# Patient Record
Sex: Male | Born: 1985 | Race: Black or African American | Hispanic: No | Marital: Married | Smoking: Never smoker
Health system: Southern US, Community
[De-identification: ages and names within clinical notes are randomized; demographics above are authoritative.]

## PROBLEM LIST (undated history)

## (undated) DIAGNOSIS — I10 Essential (primary) hypertension: Secondary | ICD-10-CM

## (undated) DIAGNOSIS — E119 Type 2 diabetes mellitus without complications: Secondary | ICD-10-CM

## (undated) DIAGNOSIS — G4730 Sleep apnea, unspecified: Secondary | ICD-10-CM

## (undated) DIAGNOSIS — M109 Gout, unspecified: Secondary | ICD-10-CM

---

## 2004-03-16 ENCOUNTER — Emergency Department (HOSPITAL_COMMUNITY): Admission: EM | Admit: 2004-03-16 | Discharge: 2004-03-16 | Payer: Self-pay | Admitting: Emergency Medicine

## 2004-05-03 ENCOUNTER — Emergency Department (HOSPITAL_COMMUNITY): Admission: EM | Admit: 2004-05-03 | Discharge: 2004-05-03 | Payer: Self-pay | Admitting: Emergency Medicine

## 2006-06-07 ENCOUNTER — Emergency Department (HOSPITAL_COMMUNITY): Admission: EM | Admit: 2006-06-07 | Discharge: 2006-06-08 | Payer: Self-pay | Admitting: Emergency Medicine

## 2006-06-08 ENCOUNTER — Emergency Department (HOSPITAL_COMMUNITY): Admission: EM | Admit: 2006-06-08 | Discharge: 2006-06-08 | Payer: Self-pay | Admitting: Emergency Medicine

## 2007-09-20 ENCOUNTER — Emergency Department (HOSPITAL_COMMUNITY): Admission: EM | Admit: 2007-09-20 | Discharge: 2007-09-20 | Payer: Self-pay | Admitting: Emergency Medicine

## 2008-11-29 ENCOUNTER — Emergency Department (HOSPITAL_COMMUNITY): Admission: EM | Admit: 2008-11-29 | Discharge: 2008-11-29 | Payer: Self-pay | Admitting: Emergency Medicine

## 2011-04-30 NOTE — Consult Note (Signed)
Christopher Hays, Christopher Hays                  ACCOUNT NO.:  [id number]   MEDICAL RECORD NO.:  [id number]          PATIENT TYPE:  EMS   LOCATION:  ED                           FACILITY:  [facility]   PHYSICIAN:  Thornton Park. Daphine Deutscher, MD  DATE OF BIRTH:  13-Mar-1986   DATE OF CONSULTATION:  06/08/2006  DATE OF DISCHARGE:                                   CONSULTATION   CHIEF COMPLAINT:  Abdominal pain   HISTORY OF PRESENT ILLNESS:  This is a 25 year old black male who came into  the ER yesterday with onset of right sided abdominal pain noted yesterday.  This did not really double him over but he was concerned about it and he  came in and he was seen by Rodrigo Ran and worked up. A CT scan without  contrast was done, which showed him to be constipated. He was given some  MiraLax to drink. He has drunk that and that has made some improvement in  his pain but he still was having some pain when you press on the right side  but no rebound or guarding. No pain with bouncing or movement. The patient  is currently hungry. He has not had any nausea, vomiting, or diarrhea.   ALLERGIES:  NO KNOWN DRUG ALLERGIES.   MEDICATIONS:  He has recently been taking MiraLax.   PHYSICAL EXAMINATION:  VITAL SIGNS:  Afebrile. Blood pressure 150/97, pulse  76, respiratory rate 16.  HEENT:  Examination unremarkable.  CHEST:  Clear.  HEART:  Sinus rhythm without murmurs or gallops.  ABDOMEN:  Obese. Minimal soreness to the right upper quadrant but I can  press deeply at McBurney's point without getting much in the way of a  response.   LABORATORY DATA:  CT scan was reviewed and there is contrast that fills a  portion of the appendix. There are no surrounding inflammatory changes.  There is some question about the mid portion being a little enlarged but  again, it is not a convincing finding by any means and certainly, there is  no inflammatory change or stranding.   White count 5,000. Hemoglobin 14.8 with a low absolute  granulocyte count.   ASSESSMENT:  This man appears constipated and a definite etiology for his  abdominal discomfort is not clear. I do not think this man has appendicitis  and I would treat him with MiraLax and I told him to get an enema and to see  if he didn't get better. If he is not better tomorrow, he certainly can come  back to the emergency room and we can re-evaluate this.   IMPRESSION:  Abdominal pain, etiology uncertain.   DISPOSITION:  The patient to go home.   PLAN:  To take an enema and to continue the MiraLax and remain hydrated.  Despite his hunger, I have asked him to stay on liquids, to try to see if we  can get his colon cleaned out and see if his pain will abate.      Thornton Park Daphine Deutscher, MD  Electronically Signed     MBM/MEDQ  D:  06/08/2006  T:  06/08/2006  Job:  [id number]

## 2011-09-17 LAB — URIC ACID: Uric Acid, Serum: 10.2 mg/dL — ABNORMAL HIGH (ref 4.0–7.8)

## 2012-04-13 ENCOUNTER — Emergency Department (HOSPITAL_BASED_OUTPATIENT_CLINIC_OR_DEPARTMENT_OTHER)
Admission: EM | Admit: 2012-04-13 | Discharge: 2012-04-14 | Disposition: A | Discharge status: Home / Self Care | Payer: Self-pay | Attending: Emergency Medicine | Admitting: Emergency Medicine

## 2012-04-13 ENCOUNTER — Encounter (HOSPITAL_BASED_OUTPATIENT_CLINIC_OR_DEPARTMENT_OTHER): Payer: Self-pay | Admitting: Emergency Medicine

## 2012-04-13 DIAGNOSIS — M533 Sacrococcygeal disorders, not elsewhere classified: Secondary | ICD-10-CM | POA: Insufficient documentation

## 2012-04-13 DIAGNOSIS — M545 Low back pain, unspecified: Secondary | ICD-10-CM

## 2012-04-13 NOTE — ED Notes (Signed)
Pt c/o lower back pain after lifting boxes at work.

## 2012-04-14 MED ORDER — CYCLOBENZAPRINE HCL 10 MG PO TABS
10.0000 mg | ORAL_TABLET | Freq: Once | ORAL | Status: AC
  Administered 2012-04-14: 10 mg via ORAL
  Filled 2012-04-14: qty 1

## 2012-04-14 MED ORDER — HYDROCODONE-ACETAMINOPHEN 5-325 MG PO TABS
1.0000 | ORAL_TABLET | Freq: Once | ORAL | Status: AC
  Administered 2012-04-14: 1 via ORAL
  Filled 2012-04-14: qty 1

## 2012-04-14 MED ORDER — HYDROCODONE-ACETAMINOPHEN 5-325 MG PO TABS: 1.0000 | ORAL_TABLET | Freq: Four times a day (QID) | ORAL | Status: AC | PRN

## 2012-04-14 MED ORDER — CYCLOBENZAPRINE HCL 10 MG PO TABS: 10.0000 mg | ORAL_TABLET | Freq: Three times a day (TID) | ORAL | Status: AC | PRN

## 2012-04-14 NOTE — ED Provider Notes (Signed)
History     CSN: [id number]  Arrival date & time 04/13/12  2316   First MD Initiated Contact with Patient 04/14/12 0112      Chief Complaint  Patient presents with  . Back Pain    (Consider location/radiation/quality/duration/timing/severity/associated sxs/prior treatment) HPI This is a 26 year old black male who works at [employer]. He was lifting boxes at work 2 days ago. At that evening he developed pain about the right sacroiliac joint. The pain is spasm-like. It is worse with certain positions and movement, improved with certain positions. He is been taking NSAIDs without relief. The pain is moderate in severity. He denies any numbness or weakness associated with it.  History reviewed. No pertinent past medical history.  History reviewed. No pertinent past surgical history.  No family history on file.  History  Substance Use Topics  . Smoking status: Never Smoker   . Smokeless tobacco: Not on file  . Alcohol Use: No      Review of Systems  All other systems reviewed and are negative.    Allergies  Review of patient's allergies indicates no known allergies.  Home Medications   Current Outpatient Rx  Name Route Sig Dispense Refill  . IBUPROFEN 400 MG PO TABS Oral Take 400 mg by mouth as needed.    Marland Kitchen NAPROXEN SODIUM 220 MG PO TABS Oral Take 220 mg by mouth 2 (two) times daily with a meal. Patient used this medication for pain.      BP 163/88  Pulse 74  Temp(Src) 98.2 F (36.8 C) (Oral)  Resp 18  Ht 6' (1.829 m)  Wt 270 lb (122.471 kg)  BMI 36.62 kg/m2  SpO2 100%  Physical Exam General: Well-developed, well-nourished male in no acute distress; appearance consistent with age of record HENT: normocephalic, atraumatic Eyes: Normal appearance Neck: supple Heart: regular rate and rhythm Lungs: Normal respiratory effort and excursion Back: Mild tenderness at right SI joint Abdomen: soft; nondistended Extremities: No deformity; full range of motion Neurologic:  Awake, alert and oriented; motor function intact in all extremities and symmetric; no facial droop Skin: Warm and dry Psychiatric: Normal mood and affect    ED Course  Procedures (including critical care time)     MDM          Hanley Seamen, MD 04/14/12 7829

## 2012-04-14 NOTE — Discharge Instructions (Signed)
Back Pain, Adult Low back pain is very common. About 1 in 5 people have back pain.The cause of low back pain is rarely dangerous. The pain often gets better over time.About half of people with a sudden onset of back pain feel better in just 2 weeks. About 8 in 10 people feel better by 6 weeks.  CAUSES Some common causes of back pain include:  Strain of the muscles or ligaments supporting the spine.   Wear and tear (degeneration) of the spinal discs.   Arthritis.   Direct injury to the back.  DIAGNOSIS Most of the time, the direct cause of low back pain is not known.However, back pain can be treated effectively even when the exact cause of the pain is unknown.Answering your caregiver's questions about your overall health and symptoms is one of the most accurate ways to make sure the cause of your pain is not dangerous. If your caregiver needs more information, he or she may order lab work or imaging tests (X-rays or MRIs).However, even if imaging tests show changes in your back, this usually does not require surgery. HOME CARE INSTRUCTIONS For many people, back pain returns.Since low back pain is rarely dangerous, it is often a condition that people can learn to manageon their own.   Remain active. It is stressful on the back to sit or stand in one place. Do not sit, drive, or stand in one place for more than 30 minutes at a time. Take short walks on level surfaces as soon as pain allows.Try to increase the length of time you walk each day.   Do not stay in bed.Resting more than 1 or 2 days can delay your recovery.   Do not avoid exercise or work.Your body is made to move.It is not dangerous to be active, even though your back may hurt.Your back will likely heal faster if you return to being active before your pain is gone.   Pay attention to your body when you bend and lift. Many people have less discomfortwhen lifting if they bend their knees, keep the load close to their  bodies,and avoid twisting. Often, the most comfortable positions are those that put less stress on your recovering back.   Find a comfortable position to sleep. Use a firm mattress and lie on your side with your knees slightly bent. If you lie on your back, put a pillow under your knees.   Only take over-the-counter or prescription medicines as directed by your caregiver. Over-the-counter medicines to reduce pain and inflammation are often the most helpful.Your caregiver may prescribe muscle relaxant drugs.These medicines help dull your pain so you can more quickly return to your normal activities and healthy exercise.   Put ice on the injured area.   Put ice in a plastic bag.   Place a towel between your skin and the bag.   Leave the ice on for 15 to 20 minutes, 3 to 4 times a day for the first 2 to 3 days. After that, ice and heat may be alternated to reduce pain and spasms.   Ask your caregiver about trying back exercises and gentle massage. This may be of some benefit.   Avoid feeling anxious or stressed.Stress increases muscle tension and can worsen back pain.It is important to recognize when you are anxious or stressed and learn ways to manage it.Exercise is a great option.  SEEK MEDICAL CARE IF:  You have pain that is not relieved with rest or medicine.   You have   pain that does not improve in 1 week.   You have new symptoms.   You are generally not feeling well.  SEEK IMMEDIATE MEDICAL CARE IF:   You have pain that radiates from your back into your legs.   You develop new bowel or bladder control problems.   You have unusual weakness or numbness in your arms or legs.   You develop nausea or vomiting.   You develop abdominal pain.   You feel faint.  Document Released: 11/29/2005 Document Revised: 11/18/2011 Document Reviewed: 04/19/2011 [employer] Patient Information 2012 [employer]. 

## 2012-04-14 NOTE — ED Notes (Signed)
Dr. Molpus at bedside. 

## 2012-09-17 ENCOUNTER — Emergency Department (HOSPITAL_BASED_OUTPATIENT_CLINIC_OR_DEPARTMENT_OTHER)
Admission: EM | Admit: 2012-09-17 | Discharge: 2012-09-17 | Disposition: A | Discharge status: Home / Self Care | Payer: Self-pay | Attending: Emergency Medicine | Admitting: Emergency Medicine

## 2012-09-17 ENCOUNTER — Encounter (HOSPITAL_BASED_OUTPATIENT_CLINIC_OR_DEPARTMENT_OTHER): Payer: Self-pay | Admitting: *Deleted

## 2012-09-17 DIAGNOSIS — R030 Elevated blood-pressure reading, without diagnosis of hypertension: Secondary | ICD-10-CM | POA: Insufficient documentation

## 2012-09-17 DIAGNOSIS — I10 Essential (primary) hypertension: Secondary | ICD-10-CM

## 2012-09-17 DIAGNOSIS — M109 Gout, unspecified: Secondary | ICD-10-CM | POA: Insufficient documentation

## 2012-09-17 HISTORY — DX: Gout, unspecified: M10.9

## 2012-09-17 LAB — URIC ACID: Uric Acid, Serum: 8.4 mg/dL — ABNORMAL HIGH (ref 4.0–7.8)

## 2012-09-17 LAB — BASIC METABOLIC PANEL
Chloride: 101 mEq/L (ref 96–112)
Creatinine, Ser: 1.1 mg/dL (ref 0.50–1.35)
GFR calc Af Amer: >90 mL/min (ref >90)
Glucose, Bld: 89 mg/dL (ref 70–99)
Potassium: 3.3 mEq/L — ABNORMAL LOW (ref 3.5–5.1)

## 2012-09-17 MED ORDER — KETOROLAC TROMETHAMINE 30 MG/ML IJ SOLN
30.0000 mg | Freq: Once | INTRAMUSCULAR | Status: AC
  Administered 2012-09-17: 30 mg via INTRAVENOUS
  Filled 2012-09-17: qty 1

## 2012-09-17 MED ORDER — NAPROXEN SODIUM 550 MG PO TABS: ORAL_TABLET | ORAL | Status: DC

## 2012-09-17 MED ORDER — SODIUM CHLORIDE 0.9 % IV SOLN
Freq: Once | INTRAVENOUS | Status: AC
  Administered 2012-09-17: 05:00:00 via INTRAVENOUS

## 2012-09-17 MED ORDER — HYDROCODONE-ACETAMINOPHEN 10-325 MG PO TABS: 1.0000 | ORAL_TABLET | Freq: Four times a day (QID) | ORAL | Status: DC | PRN

## 2012-09-17 NOTE — ED Provider Notes (Signed)
History     CSN: [id number]  Arrival date & time 09/17/12  0407   First MD Initiated Contact with Patient 09/17/12 [phone]      Chief Complaint  Patient presents with  . Hand Pain    (Consider location/radiation/quality/duration/timing/severity/associated sxs/prior treatment) HPI This is a 26 year old black male with a history of gout. His prior episodes of been in his great toe. He is here with a two-day history of pain at the base of his right thumb, the metacarpophalangeal joint. There is associated swelling but no erythema. He states the symptoms are consistent with the gout he has had in his toe. The pain is severe when he attempts to move the thumb, it is also worse with palpation. He has taken ibuprofen 400 mg without relief.  Past Medical History  Diagnosis Date  . Gout     History reviewed. No pertinent past surgical history.  No family history on file.  History  Substance Use Topics  . Smoking status: Never Smoker   . Smokeless tobacco: Not on file  . Alcohol Use: No     occasional       Review of Systems  All other systems reviewed and are negative.    Allergies  Review of patient's allergies indicates no known allergies.  Home Medications   Current Outpatient Rx  Name Route Sig Dispense Refill  . IBUPROFEN 400 MG PO TABS Oral Take 400 mg by mouth as needed.    Marland Kitchen NAPROXEN SODIUM 220 MG PO TABS Oral Take 220 mg by mouth 2 (two) times daily with a meal. Patient used this medication for pain.      BP 198/98  Pulse 65  Temp 98.4 F (36.9 C) (Oral)  Resp 20  Ht 6' (1.829 m)  Wt 260 lb (117.935 kg)  BMI 35.26 kg/m2  SpO2 100%  Physical Exam General: Well-developed, well-nourished male in no acute distress; appearance consistent with age of record HENT: normocephalic, atraumatic Eyes: Normal appearance Neck: supple Heart: regular rate and rhythm Lungs: clear to auscultation bilaterally Abdomen: soft; nondistended Extremities: No deformity;  swelling and profound tenderness of right first metacarpophalangeal joint without erythema or warmth Neurologic: Awake, alert and oriented; motor function intact in all extremities and symmetric; no facial droop Skin: Warm and dry Psychiatric: Normal mood and affect    ED Course  Procedures (including critical care time)     MDM   Nursing notes and vitals signs, including pulse oximetry, reviewed.  Summary of this visit's results, reviewed by myself:  Labs:  Results for orders placed during the hospital encounter of 09/17/12  BASIC METABOLIC PANEL      Component Value Range   Sodium 139  135 - 145 mEq/L   Potassium 3.3 (*) 3.5 - 5.1 mEq/L   Chloride 101  96 - 112 mEq/L   CO2 30  19 - 32 mEq/L   Glucose, Bld 89  70 - 99 mg/dL   BUN 13  6 - 23 mg/dL   Creatinine, Ser 6.29  0.50 - 1.35 mg/dL   Calcium 9.0  8.4 - 52.8 mg/dL   GFR calc non Af Amer >90  >90 mL/min   GFR calc Af Amer >90  >90 mL/min  URIC ACID      Component Value Range   Uric Acid, Serum 8.4 (*) 4.0 - 7.8 mg/dL            Hanley Seamen, MD 09/17/12 [phone]

## 2012-09-17 NOTE — ED Notes (Signed)
Pt. C/o of right hand/thumb pain that started on Friday. States pain with movement and to touch. Denies any injury. Thumb swollen on exam. Hx of gout.

## 2013-02-04 ENCOUNTER — Emergency Department (HOSPITAL_BASED_OUTPATIENT_CLINIC_OR_DEPARTMENT_OTHER)
Admission: EM | Admit: 2013-02-04 | Discharge: 2013-02-04 | Disposition: A | Discharge status: Home / Self Care | Payer: BC Managed Care – PPO | Attending: Emergency Medicine | Admitting: Emergency Medicine

## 2013-02-04 ENCOUNTER — Emergency Department (HOSPITAL_BASED_OUTPATIENT_CLINIC_OR_DEPARTMENT_OTHER): Payer: BC Managed Care – PPO

## 2013-02-04 ENCOUNTER — Encounter (HOSPITAL_BASED_OUTPATIENT_CLINIC_OR_DEPARTMENT_OTHER): Payer: Self-pay | Admitting: *Deleted

## 2013-02-04 DIAGNOSIS — M109 Gout, unspecified: Secondary | ICD-10-CM | POA: Insufficient documentation

## 2013-02-04 DIAGNOSIS — Z791 Long term (current) use of non-steroidal anti-inflammatories (NSAID): Secondary | ICD-10-CM | POA: Insufficient documentation

## 2013-02-04 MED ORDER — COLCHICINE 0.6 MG PO TABS: 0.6000 mg | ORAL_TABLET | Freq: Two times a day (BID) | ORAL | Status: DC

## 2013-02-04 MED ORDER — TRAMADOL HCL 50 MG PO TABS
50.0000 mg | ORAL_TABLET | Freq: Four times a day (QID) | ORAL | Status: DC | PRN
Start: 2013-02-04 — End: 2013-06-06

## 2013-02-04 NOTE — ED Notes (Signed)
Pt has hx of gout. Ran out of med a couple months ago. C/O left foot pain since this a.m.

## 2013-02-04 NOTE — ED Provider Notes (Signed)
History     CSN: [id number]  Arrival date & time 02/04/13  1143   First MD Initiated Contact with Patient 02/04/13 1210      Chief Complaint  Patient presents with  . Foot Pain    (Consider location/radiation/quality/duration/timing/severity/associated sxs/prior treatment) HPI Comments: Patient is a 27 year old male who presents with left ankle pain that started gradually this morning. Patient denies injury. The pain is aching and severe without radiation. Patient reports progressive worsening of pain. Ankle movement and weight bearing activity make the pain worse. Nothing makes the pain better. Patient reports associated swelling. Patient has not tried anything for pain relief. Patient denies obvious deformity, numbness/tingling, coolness/weakness of extremity, bruising, and any other injury. Patient has a history of gout and states this feels exactly like his gout pain. He has been treated with colchicine in the past which helps.     Patient is a 27 y.o. male presenting with lower extremity pain.  Foot Pain Associated symptoms include arthralgias.    Past Medical History  Diagnosis Date  . Gout     History reviewed. No pertinent past surgical history.  History reviewed. No pertinent family history.  History  Substance Use Topics  . Smoking status: Never Smoker   . Smokeless tobacco: Not on file  . Alcohol Use: No     Comment: occasional       Review of Systems  Musculoskeletal: Positive for arthralgias.  All other systems reviewed and are negative.    Allergies  Review of patient's allergies indicates no known allergies.  Home Medications   Current Outpatient Rx  Name  Route  Sig  Dispense  Refill  . HYDROcodone-acetaminophen (NORCO) 10-325 MG per tablet   Oral   Take 1 tablet by mouth every 6 (six) hours as needed for pain.   20 tablet   0   . ibuprofen (ADVIL,MOTRIN) 400 MG tablet   Oral   Take 400 mg by mouth as needed.         . naproxen  sodium (ANAPROX DS) 550 MG tablet      Take 1 tablet twice daily for gout. Best taken with a meal.   30 tablet   0   . naproxen sodium (ANAPROX) 220 MG tablet   Oral   Take 220 mg by mouth 2 (two) times daily with a meal. Patient used this medication for pain.           BP 163/103  Pulse 64  Temp(Src) 98.8 F (37.1 C) (Oral)  Resp 20  Ht 6' (1.829 m)  Wt 260 lb (117.935 kg)  BMI 35.25 kg/m2  SpO2 99%  Physical Exam  Nursing note and vitals reviewed. Constitutional: He is oriented to person, place, and time. He appears well-developed and well-nourished. No distress.  HENT:  Head: Normocephalic and atraumatic.  Eyes: Conjunctivae are normal.  Neck: Normal range of motion.  Cardiovascular: Normal rate and regular rhythm.  Exam reveals no gallop and no friction rub.   No murmur heard. Pulmonary/Chest: Effort normal and breath sounds normal. He has no wheezes. He has no rales. He exhibits no tenderness.  Abdominal: Soft. There is no tenderness.  Musculoskeletal: Normal range of motion.  Tenderness to palpation of left lateral malleolus. Mild edema. No obvious deformity. ROM slightly limited due to pain.   Neurological: He is alert and oriented to person, place, and time. Coordination normal.  Speech is goal-oriented. Moves limbs without ataxia.   Skin: Skin is warm and  dry.  Psychiatric: He has a normal mood and affect. His behavior is normal.    ED Course  Procedures (including critical care time)  Labs Reviewed - No data to display Dg Foot Complete Left  02/04/2013  *RADIOLOGY REPORT*  Clinical Data: Lateral foot pain, history of gout  LEFT FOOT - COMPLETE 3+ VIEW  Comparison: None.  Findings: Three views of the left foot submitted.  Mild hallux valgus deformity.  No acute fracture or subluxation.  No periosteal reaction or bony erosion.  Mild dorsal spurring tarsal region.  IMPRESSION: No acute fracture or subluxation.  Mild degenerative changes.  Mild hallux valgus.    Original Report Authenticated By: Natasha Mead, M.D.      1. Gout       MDM  12:39 PM Xray unremarkable. No signs of neurovascular compromise. Patient will have pain medication and colchicine for suspected gout. Patient instructed to return with worsening or concerning symptoms.         Emilia Beck, PA-C 02/04/13 1245

## 2013-02-04 NOTE — ED Provider Notes (Signed)
Medical screening examination/treatment/procedure(s) were performed by non-physician practitioner and as supervising physician I was immediately available for consultation/collaboration.  Geoffery Lyons, MD 02/04/13 [phone]

## 2013-02-05 ENCOUNTER — Encounter (HOSPITAL_BASED_OUTPATIENT_CLINIC_OR_DEPARTMENT_OTHER): Payer: Self-pay | Admitting: *Deleted

## 2013-02-05 ENCOUNTER — Emergency Department (HOSPITAL_BASED_OUTPATIENT_CLINIC_OR_DEPARTMENT_OTHER)
Admission: EM | Admit: 2013-02-05 | Discharge: 2013-02-05 | Disposition: A | Discharge status: Home / Self Care | Payer: BC Managed Care – PPO | Attending: Emergency Medicine | Admitting: Emergency Medicine

## 2013-02-05 ENCOUNTER — Emergency Department (HOSPITAL_BASED_OUTPATIENT_CLINIC_OR_DEPARTMENT_OTHER): Payer: BC Managed Care – PPO

## 2013-02-05 DIAGNOSIS — M7989 Other specified soft tissue disorders: Secondary | ICD-10-CM | POA: Insufficient documentation

## 2013-02-05 DIAGNOSIS — M109 Gout, unspecified: Secondary | ICD-10-CM | POA: Insufficient documentation

## 2013-02-05 DIAGNOSIS — Z79899 Other long term (current) drug therapy: Secondary | ICD-10-CM | POA: Insufficient documentation

## 2013-02-05 DIAGNOSIS — IMO0001 Reserved for inherently not codable concepts without codable children: Secondary | ICD-10-CM | POA: Insufficient documentation

## 2013-02-05 MED ORDER — OXYCODONE-ACETAMINOPHEN 5-325 MG PO TABS: 2.0000 | ORAL_TABLET | ORAL | Status: DC | PRN

## 2013-02-05 MED ORDER — INDOMETHACIN 25 MG PO CAPS: 25.0000 mg | ORAL_CAPSULE | Freq: Three times a day (TID) | ORAL | Status: DC | PRN

## 2013-02-05 MED ORDER — OXYCODONE-ACETAMINOPHEN 5-325 MG PO TABS
2.0000 | ORAL_TABLET | Freq: Once | ORAL | Status: AC
  Administered 2013-02-05: 2 via ORAL
  Filled 2013-02-05 (×2): qty 2

## 2013-02-05 NOTE — ED Provider Notes (Signed)
History     CSN: [id number]  Arrival date & time 02/05/13  2126   First MD Initiated Contact with Patient 02/05/13 2308      Chief Complaint  Patient presents with  . Foot Pain    (Consider location/radiation/quality/duration/timing/severity/associated sxs/prior treatment) Patient is a 27 y.o. male presenting with lower extremity pain. The history is provided by the patient. No language interpreter was used.  Foot Pain This is a new problem. The current episode started yesterday. The problem occurs constantly. The problem has been unchanged. Associated symptoms include joint swelling and myalgias. Nothing aggravates the symptoms. He has tried nothing for the symptoms. The treatment provided moderate relief.  Pt reports he has gout.   Pt was seen here yesterday and was treated.   Pt reports increased pain and swelling.  No relief with medications given  Past Medical History  Diagnosis Date  . Gout     History reviewed. No pertinent past surgical history.  No family history on file.  History  Substance Use Topics  . Smoking status: Never Smoker   . Smokeless tobacco: Never Used  . Alcohol Use: Yes     Comment: occasional       Review of Systems  Musculoskeletal: Positive for myalgias and joint swelling.  All other systems reviewed and are negative.    Allergies  Review of patient's allergies indicates no known allergies.  Home Medications   Current Outpatient Rx  Name  Route  Sig  Dispense  Refill  . colchicine 0.6 MG tablet   Oral   Take 1 tablet (0.6 mg total) by mouth 2 (two) times daily.   8 tablet   0   . traMADol (ULTRAM) 50 MG tablet   Oral   Take 1 tablet (50 mg total) by mouth every 6 (six) hours as needed for pain.   15 tablet   0   . HYDROcodone-acetaminophen (NORCO) 10-325 MG per tablet   Oral   Take 1 tablet by mouth every 6 (six) hours as needed for pain.   20 tablet   0   . ibuprofen (ADVIL,MOTRIN) 400 MG tablet   Oral   Take 400 mg  by mouth as needed.         . indomethacin (INDOCIN) 25 MG capsule   Oral   Take 1 capsule (25 mg total) by mouth 3 (three) times daily as needed.   21 capsule   0   . naproxen sodium (ANAPROX DS) 550 MG tablet      Take 1 tablet twice daily for gout. Best taken with a meal.   30 tablet   0   . naproxen sodium (ANAPROX) 220 MG tablet   Oral   Take 220 mg by mouth 2 (two) times daily with a meal. Patient used this medication for pain.         Marland Kitchen oxyCODONE-acetaminophen (PERCOCET/ROXICET) 5-325 MG per tablet   Oral   Take 2 tablets by mouth every 4 (four) hours as needed for pain.   16 tablet   0     BP 158/69  Pulse 93  Temp(Src) 99.2 F (37.3 C) (Oral)  Resp 18  Ht 6' (1.829 m)  Wt 260 lb (117.935 kg)  BMI 35.25 kg/m2  SpO2 100%  Physical Exam  Nursing note and vitals reviewed. Constitutional: He is oriented to person, place, and time. He appears well-developed and well-nourished.  HENT:  Head: Normocephalic.  Musculoskeletal: He exhibits tenderness.  Swollen tender left foot,  Neurological: He is alert and oriented to person, place, and time.  Skin: Skin is warm.  Psychiatric: He has a normal mood and affect.    ED Course  Procedures (including critical care time)  Labs Reviewed - No data to display Dg Foot Complete Left  02/04/2013  *RADIOLOGY REPORT*  Clinical Data: Lateral foot pain, history of gout  LEFT FOOT - COMPLETE 3+ VIEW  Comparison: None.  Findings: Three views of the left foot submitted.  Mild hallux valgus deformity.  No acute fracture or subluxation.  No periosteal reaction or bony erosion.  Mild dorsal spurring tarsal region.  IMPRESSION: No acute fracture or subluxation.  Mild degenerative changes.  Mild hallux valgus.   Original Report Authenticated By: Christopher Hays, M.D.      1. Gout attack       MDM  Pt given rx for indocin and percocet.   Pt eports indocin has worked best in the past       Christopher Hays Christopher Hays, Christopher Hays 02/05/13 2325

## 2013-02-05 NOTE — ED Notes (Signed)
PA at bedside.

## 2013-02-05 NOTE — ED Notes (Signed)
Pt reports left foot pain 1 day- hx of gout- denies inury

## 2013-02-06 NOTE — ED Provider Notes (Signed)
Medical screening examination/treatment/procedure(s) were performed by non-physician practitioner and as supervising physician I was immediately available for consultation/collaboration.  Hilaria Titsworth, MD 02/06/13 0017 

## 2013-05-10 ENCOUNTER — Emergency Department (HOSPITAL_BASED_OUTPATIENT_CLINIC_OR_DEPARTMENT_OTHER)
Admission: EM | Admit: 2013-05-10 | Discharge: 2013-05-11 | Disposition: A | Discharge status: Home / Self Care | Payer: BC Managed Care – PPO | Attending: Emergency Medicine | Admitting: Emergency Medicine

## 2013-05-10 ENCOUNTER — Encounter (HOSPITAL_BASED_OUTPATIENT_CLINIC_OR_DEPARTMENT_OTHER): Payer: Self-pay

## 2013-05-10 DIAGNOSIS — M109 Gout, unspecified: Secondary | ICD-10-CM | POA: Insufficient documentation

## 2013-05-10 DIAGNOSIS — Z79899 Other long term (current) drug therapy: Secondary | ICD-10-CM | POA: Insufficient documentation

## 2013-05-10 NOTE — ED Notes (Signed)
Right elbow swelling x 2days-denies injury hx of gout

## 2013-05-11 MED ORDER — HYDROCODONE-ACETAMINOPHEN 10-325 MG PO TABS: 1.0000 | ORAL_TABLET | Freq: Four times a day (QID) | ORAL | Status: DC | PRN

## 2013-05-11 MED ORDER — NAPROXEN SODIUM 550 MG PO TABS: ORAL_TABLET | ORAL | Status: DC

## 2013-05-11 MED ORDER — NAPROXEN 250 MG PO TABS
500.0000 mg | ORAL_TABLET | Freq: Once | ORAL | Status: AC
  Administered 2013-05-11: 500 mg via ORAL
  Filled 2013-05-11: qty 2

## 2013-05-11 NOTE — ED Provider Notes (Signed)
History     CSN: [id number]  Arrival date & time 05/10/13  2254   First MD Initiated Contact with Patient 05/11/13 [phone]      Chief Complaint  Patient presents with  . Elbow Swelling     (Consider location/radiation/quality/duration/timing/severity/associated sxs/prior treatment) HPI This is a 27 year old male with a history of gout. He is here with a 2 to three-day history of pain and swelling in his right elbow. He states the pain is characterized as deep and similar to previous gout attacks. The pain is moderate. It is worse with movement or palpation. He has been taking ibuprofen with only partial relief. He denies pain in other locations. He is never seen a rheumatologist for his gout.  Past Medical History  Diagnosis Date  . Gout     History reviewed. No pertinent past surgical history.  No family history on file.  History  Substance Use Topics  . Smoking status: Never Smoker   . Smokeless tobacco: Never Used  . Alcohol Use: Yes     Comment: occasional       Review of Systems  All other systems reviewed and are negative.    Allergies  Review of patient's allergies indicates no known allergies.  Home Medications   Current Outpatient Rx  Name  Route  Sig  Dispense  Refill  . colchicine 0.6 MG tablet   Oral   Take 1 tablet (0.6 mg total) by mouth 2 (two) times daily.   8 tablet   0   . HYDROcodone-acetaminophen (NORCO) 10-325 MG per tablet   Oral   Take 1 tablet by mouth every 6 (six) hours as needed for pain.   20 tablet   0   . ibuprofen (ADVIL,MOTRIN) 400 MG tablet   Oral   Take 400 mg by mouth as needed.         . indomethacin (INDOCIN) 25 MG capsule   Oral   Take 1 capsule (25 mg total) by mouth 3 (three) times daily as needed.   21 capsule   0   . naproxen sodium (ANAPROX DS) 550 MG tablet      Take 1 tablet twice daily for gout. Best taken with a meal.   30 tablet   0   . naproxen sodium (ANAPROX) 220 MG tablet   Oral   Take  220 mg by mouth 2 (two) times daily with a meal. Patient used this medication for pain.         Marland Kitchen oxyCODONE-acetaminophen (PERCOCET/ROXICET) 5-325 MG per tablet   Oral   Take 2 tablets by mouth every 4 (four) hours as needed for pain.   16 tablet   0   . traMADol (ULTRAM) 50 MG tablet   Oral   Take 1 tablet (50 mg total) by mouth every 6 (six) hours as needed for pain.   15 tablet   0     BP 179/86  Pulse 70  Temp(Src) 99.5 F (37.5 C) (Oral)  Resp 16  Ht 6' (1.829 m)  Wt 260 lb (117.935 kg)  BMI 35.25 kg/m2  SpO2 100%  Physical Exam General: Well-developed, well-nourished male in no acute distress; appearance consistent with age of record HENT: normocephalic, atraumatic Eyes: pupils equal round and reactive to light; extraocular muscles intact Neck: supple; nontender Heart: regular rate and rhythm Lungs: clear to auscultation bilaterally Chest: Nontender Abdomen: soft; nondistended; nontender Extremities: No deformity; full range of motion; pulses normal; swelling and tenderness of right elbow without  erythema, warmth or bony point tenderness; pain on range of motion of right elbow Neurologic: Awake, alert and oriented; motor function intact in all extremities and symmetric; no facial droop Skin: Warm and dry Psychiatric: Normal mood and affect    ED Course  Procedures (including critical care time)    MDM  Symptoms consistent with gout. It is noted that patient's gout tends to involve different joints with each attack. He is encouraged to followup with a rheumatologist.        Hanley Seamen, MD 05/11/13 [phone]

## 2013-06-06 ENCOUNTER — Ambulatory Visit: Payer: BC Managed Care – PPO | Admitting: Physician Assistant

## 2013-06-06 VITALS — BP 140/92 | HR 74 | Temp 98.1°F | Resp 18 | Ht 71.0 in | Wt 252.0 lb

## 2013-06-06 DIAGNOSIS — M109 Gout, unspecified: Secondary | ICD-10-CM | POA: Insufficient documentation

## 2013-06-06 MED ORDER — INDOMETHACIN 50 MG PO CAPS: 50.0000 mg | ORAL_CAPSULE | Freq: Three times a day (TID) | ORAL | Status: DC

## 2013-06-06 MED ORDER — OXYCODONE-ACETAMINOPHEN 5-325 MG PO TABS: 1.0000 | ORAL_TABLET | Freq: Four times a day (QID) | ORAL | Status: DC | PRN

## 2013-06-06 NOTE — Progress Notes (Signed)
  Subjective:    Patient ID: Christopher Hays, male    DOB: [birth year], 27 y.o.   MRN: [id number]  HPI   Christopher Hays is a 27 yr old male here concerned for a gout flare in the right elbow.  Began last night.  States the pain is similar to previous gout attacks.  On review of chart, pt has been seen in the ED for gout involving multiple joints.  Pt reports affected joints have included elbows, knees, ankles, and toes.  No known injury to the elbow but pt does work at [employer] and [employer].  He has not taken anything for symptoms yet.  Chart review indicates elevated uric acid in the past.  Pt is not on urate lowering therapy.  He does have a family history of gout.  No fever or chills.  No other involved joints today.  Pt does not have a PCP.  He is trying to set up a rheum appt per ED recs at last visit.  Indomethacin has worked well in the past for gout flares.  Also requests percocet for pain.  States other pain pills are not strong enough.     Review of Systems  Constitutional: Negative for fever and chills.  HENT: Negative.   Respiratory: Negative.   Cardiovascular: Negative.   Gastrointestinal: Negative.   Musculoskeletal: Positive for arthralgias.  Skin: Negative.   Neurological: Negative.        Objective:   Physical Exam  Vitals reviewed. Constitutional: He is oriented to person, place, and time. He appears well-developed and well-nourished. No distress.  HENT:  Head: Normocephalic and atraumatic.  Eyes: Conjunctivae are normal. No scleral icterus.  Cardiovascular: Normal rate, regular rhythm and normal heart sounds.   Pulmonary/Chest: Effort normal and breath sounds normal. He has no wheezes. He has no rales.  Abdominal: Soft. There is no tenderness.  Musculoskeletal:       Right elbow: He exhibits decreased range of motion and swelling. Tenderness found.  Right elbow TTP but not exquisitely so; ROM limited by pain; slight warmth and slight edema as compared to the left; difficult to  appreciate erythema given pt's dark complexion  Neurological: He is alert and oriented to person, place, and time.  Skin: Skin is warm and dry.  Psychiatric: He has a normal mood and affect. His behavior is normal.       Assessment & Plan:  Gout - Plan: indomethacin (INDOCIN) 50 MG capsule, oxyCODONE-acetaminophen (PERCOCET/ROXICET) 5-325 MG per tablet, Ambulatory referral to Rheumatology   Christopher Hays is a 27 yr old male here with what appears to be an acute gout flare.  His presentation is certainly not that of classic gout, but this feels similar to pt's previous gout flares.  He has had elevated serum uric acid in the past.  He is afebrile.  Will treat as gout with indomethaicn TID.  Percocet prn pain for the next 3 days (#12, no refills).  Agree with ED recs that given pt's involvement of multiple joints, would recommend rheum eval to r/o other causes of symptoms.  I have placed a referral for this.  Discussed dietary modifications.  Printed pt edu materials regarding urate lowering therapy which may be beneficial for pt.  Discussed RTC precautions.  Pt understands and is in agreement with this plan.

## 2013-06-06 NOTE — Patient Instructions (Addendum)
Begin taking Indomethacin 50mg  every 8 hours with food.  Continue this for 1-2 days after symptoms resolve.  Percocet if needed for breakthrough pain.  I would encourage you to make a rheumatology appointment for further evaluation (I can put in a referral for this if needed).  Think about trying urate lowering therapy in the future to help prevent attacks.  Please let us know if any symptoms are worsening or not improving.   Gout Gout is an inflammatory condition (arthritis) caused by a buildup of uric acid crystals in the joints. Uric acid is a chemical that is normally present in the blood. Under some circumstances, uric acid can form into crystals in your joints. This causes joint redness, soreness, and swelling (inflammation). Repeat attacks are common. Over time, uric acid crystals can form into masses (tophi) near a joint, causing disfigurement. Gout is treatable and often preventable. CAUSES  The disease begins with elevated levels of uric acid in the blood. Uric acid is produced by your body when it breaks down a naturally found substance called purines. This also happens when you eat certain foods such as meats and fish. Causes of an elevated uric acid level include:  Being passed down from parent to child (heredity).  Diseases that cause increased uric acid production (obesity, psoriasis, some cancers).  Excessive alcohol use.  Diet, especially diets rich in meat and seafood.  Medicines, including certain cancer-fighting drugs (chemotherapy), diuretics, and aspirin.  Chronic kidney disease. The kidneys are no longer able to remove uric acid well.  Problems with metabolism. Conditions strongly associated with gout include:  Obesity.  High blood pressure.  High cholesterol.  Diabetes. Not everyone with elevated uric acid levels gets gout. It is not understood why some people get gout and others do not. Surgery, joint injury, and eating too much of certain foods are some of the  factors that can lead to gout. SYMPTOMS   An attack of gout comes on quickly. It causes intense pain with redness, swelling, and warmth in a joint.  Fever can occur.  Often, only one joint is involved. Certain joints are more commonly involved:  Base of the big toe.  Knee.  Ankle.  Wrist.  Finger. Without treatment, an attack usually goes away in a few days to weeks. Between attacks, you usually will not have symptoms, which is different from many other forms of arthritis. DIAGNOSIS  Your caregiver will suspect gout based on your symptoms and exam. Removal of fluid from the joint (arthrocentesis) is done to check for uric acid crystals. Your caregiver will give you a medicine that numbs the area (local anesthetic) and use a needle to remove joint fluid for exam. Gout is confirmed when uric acid crystals are seen in joint fluid, using a special microscope. Sometimes, blood, urine, and X-ray tests are also used. TREATMENT  There are 2 phases to gout treatment: treating the sudden onset (acute) attack and preventing attacks (prophylaxis). Treatment of an Acute Attack  Medicines are used. These include anti-inflammatory medicines or steroid medicines.  An injection of steroid medicine into the affected joint is sometimes necessary.  The painful joint is rested. Movement can worsen the arthritis.  You may use warm or cold treatments on painful joints, depending which works best for you.  Discuss the use of coffee, vitamin C, or cherries with your caregiver. These may be helpful treatment options. Treatment to Prevent Attacks After the acute attack subsides, your caregiver may advise prophylactic medicine. These medicines either help  your kidneys eliminate uric acid from your body or decrease your uric acid production. You may need to stay on these medicines for a very long time. The early phase of treatment with prophylactic medicine can be associated with an increase in acute gout  attacks. For this reason, during the first few months of treatment, your caregiver may also advise you to take medicines usually used for acute gout treatment. Be sure you understand your caregiver's directions. You should also discuss dietary treatment with your caregiver. Certain foods such as meats and fish can increase uric acid levels. Other foods such as dairy can decrease levels. Your caregiver can give you a list of foods to avoid. HOME CARE INSTRUCTIONS   Do not take aspirin to relieve pain. This raises uric acid levels.  Only take over-the-counter or prescription medicines for pain, discomfort, or fever as directed by your caregiver.  Rest the joint as much as possible. When in bed, keep sheets and blankets off painful areas.  Keep the affected joint raised (elevated).  Use crutches if the painful joint is in your leg.  Drink enough water and fluids to keep your urine clear or pale yellow. This helps your body get rid of uric acid. Do not drink alcoholic beverages. They slow the passage of uric acid.  Follow your caregiver's dietary instructions. Pay careful attention to the amount of protein you eat. Your daily diet should emphasize fruits, vegetables, whole grains, and fat-free or low-fat milk products.  Maintain a healthy body weight. SEEK MEDICAL CARE IF:   You have an oral temperature above 102 F (38.9 C).  You develop diarrhea, vomiting, or any side effects from medicines.  You do not feel better in 24 hours, or you are getting worse. SEEK IMMEDIATE MEDICAL CARE IF:   Your joint becomes suddenly more tender and you have:  Chills.  An oral temperature above 102 F (38.9 C), not controlled by medicine. MAKE SURE YOU:   Understand these instructions.  Will watch your condition.  Will get help right away if you are not doing well or get worse. Document Released: 11/26/2000 Document Revised: 02/21/2012 Document Reviewed: 03/09/2010 [facility] Patient Information  2014 [place], [place].

## 2013-11-12 ENCOUNTER — Ambulatory Visit (INDEPENDENT_AMBULATORY_CARE_PROVIDER_SITE_OTHER): Payer: BC Managed Care – PPO | Admitting: Family Medicine

## 2013-11-12 VITALS — BP 140/90 | HR 93 | Temp 99.4°F | Resp 16 | Ht 72.0 in | Wt 273.0 lb

## 2013-11-12 DIAGNOSIS — M79644 Pain in right finger(s): Secondary | ICD-10-CM

## 2013-11-12 DIAGNOSIS — M109 Gout, unspecified: Secondary | ICD-10-CM

## 2013-11-12 DIAGNOSIS — M79609 Pain in unspecified limb: Secondary | ICD-10-CM

## 2013-11-12 MED ORDER — OXYCODONE-ACETAMINOPHEN 5-325 MG PO TABS: 1.0000 | ORAL_TABLET | Freq: Three times a day (TID) | ORAL | Status: DC | PRN

## 2013-11-12 MED ORDER — PREDNISONE 20 MG PO TABS: 20.0000 mg | ORAL_TABLET | Freq: Every day | ORAL | Status: DC

## 2013-11-12 NOTE — Patient Instructions (Signed)

## 2013-11-12 NOTE — Progress Notes (Signed)
Chief Complaint:  Chief Complaint  Patient presents with  . Gout    (R) index finger    HPI: Christopher Hays is a 27 y.o. male who is here for acute  right index finget gouty attack x 4-5 days over thanks giving. He did not take his rx medicine x 2 days while at home for Thanksgiving in [facility]. He was not following a gout diet during Thanskgiving. He did not take it because forgot his medicine. He took it several days later and  has doubled up on his gout medicine colchicine but sxs have not resolved, he also takes allopurinol. Stiff, swollen, red and thobbing right index finger, similar to prior gout episode but never has had it in his hand. He is right hand dominant, he works at [employer] He was dx in 2007 with gout but has not really taken anything regular or scheduled until about Sept of 2013 He has been getting gout treatment since September with cochicine and allopurinol by Dr Barnet Glasgow ( rheumatology) and only 1 flare up since Sept , prior to this he was getting them every other month.   Past Medical History  Diagnosis Date  . Gout    History reviewed. No pertinent past surgical history. History   Social History  . Marital Status: Single    Spouse Name: N/A    Number of Children: N/A  . Years of Education: N/A   Social History Main Topics  . Smoking status: Never Smoker   . Smokeless tobacco: Never Used  . Alcohol Use: Yes     Comment: occasional   . Drug Use: No  . Sexual Activity: None   Other Topics Concern  . None   Social History Narrative  . None   Family History  Problem Relation Age of Onset  . Gout Paternal Grandmother   . Gout Paternal Grandfather    No Known Allergies Prior to Admission medications   Medication Sig Start Date End Date Taking? Authorizing Provider  colchicine 0.6 MG tablet Take 0.6 mg by mouth daily.   Yes Historical Provider, MD  indomethacin (INDOCIN) 50 MG capsule Take 1 capsule (50 mg total) by mouth 3 (three)  times daily with meals. 06/06/13   Eleanore Delia Chimes, PA-C  oxyCODONE-acetaminophen (PERCOCET/ROXICET) 5-325 MG per tablet Take 1 tablet by mouth every 6 (six) hours as needed for pain. 06/06/13   Eleanore Delia Chimes, PA-C     ROS: The patient denies fevers, chills, night sweats, unintentional weight loss, chest pain, palpitations, wheezing, dyspnea on exertion, nausea, vomiting, abdominal pain, dysuria, hematuria, melena, numbness, weakness, or tingling.   All other systems have been reviewed and were otherwise negative with the exception of those mentioned in the HPI and as above.    PHYSICAL EXAM: Filed Vitals:   11/12/13 1315  BP: 140/90  Pulse: 93  Temp: 99.4 F (37.4 C)  Resp: 16   Filed Vitals:   11/12/13 1315  Height: 6' (1.829 m)  Weight: 273 lb (123.832 kg)   Body mass index is 37.02 kg/(m^2).  General: Alert, no acute distress HEENT:  Normocephalic, atraumatic, oropharynx patent. EOMI, PERRLA Cardiovascular:  Regular rate and rhythm, no rubs murmurs or gallops.  No Carotid bruits, radial pulse intact. No pedal edema.  Respiratory: Clear to auscultation bilaterally.  No wheezes, rales, or rhonchi.  No cyanosis, no use of accessory musculature GI: No organomegaly, abdomen is soft and non-tender, positive bowel sounds.  No masses. Skin:  No rashes. Neurologic: Facial musculature symmetric. Psychiatric: Patient is appropriate throughout our interaction. Lymphatic: No cervical lymphadenopathy Musculoskeletal: Gait intact. + right index finger swelling, pain, redness, tender, + radial pulse,   5/5 stregnth, sensation intact, minimally warm  LABS: Results for orders placed during the hospital encounter of 09/17/12  BASIC METABOLIC PANEL      Result Value Range   Sodium 139  135 - 145 mEq/L   Potassium 3.3 (*) 3.5 - 5.1 mEq/L   Chloride 101  96 - 112 mEq/L   CO2 30  19 - 32 mEq/L   Glucose, Bld 89  70 - 99 mg/dL   BUN 13  6 - 23 mg/dL   Creatinine, Ser 6.96  0.50 - 1.35 mg/dL    Calcium 9.0  8.4 - 29.5 mg/dL   GFR calc non Af Amer >90  >90 mL/min   GFR calc Af Amer >90  >90 mL/min  URIC ACID      Result Value Range   Uric Acid, Serum 8.4 (*) 4.0 - 7.8 mg/dL     EKG/XRAY:   Primary read interpreted by Dr. Conley Rolls at [facility]   ASSESSMENT/PLAN: Encounter Diagnoses  Name Primary?  . Acute gouty arthropathy Yes  . Gout   . Finger pain, right    Rx Prednisone taper Rx Percocet Continue with colchicine and allopurinol Make sure that he monitors for worsening sxs F/u prn  Gross sideeffects, risk and benefits, and alternatives of medications d/w patient. Patient is aware that all medications have potential sideeffects and we are unable to predict every sideeffect or drug-drug interaction that may occur.  Diala Waxman PHUONG, DO 11/12/2013 2:02 PM

## 2014-05-04 ENCOUNTER — Emergency Department (HOSPITAL_BASED_OUTPATIENT_CLINIC_OR_DEPARTMENT_OTHER)
Admission: EM | Admit: 2014-05-04 | Discharge: 2014-05-04 | Disposition: A | Discharge status: Home / Self Care | Payer: BC Managed Care – PPO | Attending: Emergency Medicine | Admitting: Emergency Medicine

## 2014-05-04 ENCOUNTER — Emergency Department (HOSPITAL_BASED_OUTPATIENT_CLINIC_OR_DEPARTMENT_OTHER): Payer: BC Managed Care – PPO

## 2014-05-04 ENCOUNTER — Encounter (HOSPITAL_BASED_OUTPATIENT_CLINIC_OR_DEPARTMENT_OTHER): Payer: Self-pay | Admitting: Emergency Medicine

## 2014-05-04 DIAGNOSIS — Z79899 Other long term (current) drug therapy: Secondary | ICD-10-CM | POA: Insufficient documentation

## 2014-05-04 DIAGNOSIS — Z791 Long term (current) use of non-steroidal anti-inflammatories (NSAID): Secondary | ICD-10-CM | POA: Insufficient documentation

## 2014-05-04 DIAGNOSIS — Y9389 Activity, other specified: Secondary | ICD-10-CM | POA: Insufficient documentation

## 2014-05-04 DIAGNOSIS — M109 Gout, unspecified: Secondary | ICD-10-CM | POA: Insufficient documentation

## 2014-05-04 DIAGNOSIS — Y9289 Other specified places as the place of occurrence of the external cause: Secondary | ICD-10-CM | POA: Insufficient documentation

## 2014-05-04 DIAGNOSIS — IMO0002 Reserved for concepts with insufficient information to code with codable children: Secondary | ICD-10-CM | POA: Insufficient documentation

## 2014-05-04 DIAGNOSIS — W1809XA Striking against other object with subsequent fall, initial encounter: Secondary | ICD-10-CM | POA: Insufficient documentation

## 2014-05-04 DIAGNOSIS — T148XXA Other injury of unspecified body region, initial encounter: Secondary | ICD-10-CM

## 2014-05-04 DIAGNOSIS — S60229A Contusion of unspecified hand, initial encounter: Secondary | ICD-10-CM | POA: Insufficient documentation

## 2014-05-04 MED ORDER — TRAMADOL HCL 50 MG PO TABS
50.0000 mg | ORAL_TABLET | Freq: Once | ORAL | Status: AC
  Administered 2014-05-04: 50 mg via ORAL
  Filled 2014-05-04: qty 1

## 2014-05-04 MED ORDER — DICLOFENAC POTASSIUM 50 MG PO TABS: 50.0000 mg | ORAL_TABLET | Freq: Two times a day (BID) | ORAL | Status: DC

## 2014-05-04 NOTE — Discharge Instructions (Signed)
Contusion °A contusion is a deep bruise. Contusions happen when an injury causes bleeding under the skin. Signs of bruising include pain, puffiness (swelling), and discolored skin. The contusion may turn blue, purple, or yellow. °HOME CARE  °· Put ice on the injured area. °· Put ice in a plastic bag. °· Place a towel between your skin and the bag. °· Leave the ice on for 15-20 minutes, 03-04 times a day. °· Only take medicine as told by your doctor. °· Rest the injured area. °· If possible, raise (elevate) the injured area to lessen puffiness. °GET HELP RIGHT AWAY IF:  °· You have more bruising or puffiness. °· You have pain that is getting worse. °· Your puffiness or pain is not helped by medicine. °MAKE SURE YOU:  °· Understand these instructions. °· Will watch your condition. °· Will get help right away if you are not doing well or get worse. °Document Released: 05/17/2008 Document Revised: 02/21/2012 Document Reviewed: 10/04/2011 °ExitCare® Patient Information ©2014 [employer]. ° °Cryotherapy °Cryotherapy means treatment with cold. Ice or gel packs can be used to reduce both pain and swelling. Ice is the most helpful within the first 24 to 48 hours after an injury or flareup from overusing a muscle or joint. Sprains, strains, spasms, burning pain, shooting pain, and aches can all be eased with ice. Ice can also be used when recovering from surgery. Ice is effective, has very few side effects, and is safe for most people to use. °PRECAUTIONS  °Ice is not a safe treatment option for people with: °· Raynaud's phenomenon. This is a condition affecting small blood vessels in the extremities. Exposure to cold may cause your problems to return. °· Cold hypersensitivity. There are many forms of cold hypersensitivity, including: °· Cold urticaria. Red, itchy hives appear on the skin when the tissues begin to warm after being iced. °· Cold erythema. This is a red, itchy rash caused by exposure to cold. °· Cold  hemoglobinuria. Red blood cells break down when the tissues begin to warm after being iced. The hemoglobin that carry oxygen are passed into the urine because they cannot combine with blood proteins fast enough. °· Numbness or altered sensitivity in the area being iced. °If you have any of the following conditions, do not use ice until you have discussed cryotherapy with your caregiver: °· Heart conditions, such as arrhythmia, angina, or chronic heart disease. °· High blood pressure. °· Healing wounds or open skin in the area being iced. °· Current infections. °· Rheumatoid arthritis. °· Poor circulation. °· Diabetes. °Ice slows the blood flow in the region it is applied. This is beneficial when trying to stop inflamed tissues from spreading irritating chemicals to surrounding tissues. However, if you expose your skin to cold temperatures for too long or without the proper protection, you can damage your skin or nerves. Watch for signs of skin damage due to cold. °HOME CARE INSTRUCTIONS °Follow these tips to use ice and cold packs safely. °· Place a dry or damp towel between the ice and skin. A damp towel will cool the skin more quickly, so you may need to shorten the time that the ice is used. °· For a more rapid response, add gentle compression to the ice. °· Ice for no more than 10 to 20 minutes at a time. The bonier the area you are icing, the less time it will take to get the benefits of ice. °· Check your skin after 5 minutes to make sure   there are no signs of a poor response to cold or skin damage. °· Rest 20 minutes or more in between uses. °· Once your skin is numb, you can end your treatment. You can test numbness by very lightly touching your skin. The touch should be so light that you do not see the skin dimple from the pressure of your fingertip. When using ice, most people will feel these normal sensations in this order: cold, burning, aching, and numbness. °· Do not use ice on someone who cannot  communicate their responses to pain, such as small children or people with dementia. °HOW TO MAKE AN ICE PACK °Ice packs are the most common way to use ice therapy. Other methods include ice massage, ice baths, and cryo-sprays. Muscle creams that cause a cold, tingly feeling do not offer the same benefits that ice offers and should not be used as a substitute unless recommended by your caregiver. °To make an ice pack, do one of the following: °· Place crushed ice or a bag of frozen vegetables in a sealable plastic bag. Squeeze out the excess air. Place this bag inside another plastic bag. Slide the bag into a pillowcase or place a damp towel between your skin and the bag. °· Mix 3 parts water with 1 part rubbing alcohol. Freeze the mixture in a sealable plastic bag. When you remove the mixture from the freezer, it will be slushy. Squeeze out the excess air. Place this bag inside another plastic bag. Slide the bag into a pillowcase or place a damp towel between your skin and the bag. °SEEK MEDICAL CARE IF: °· You develop white spots on your skin. This may give the skin a blotchy (mottled) appearance. °· Your skin turns blue or pale. °· Your skin becomes waxy or hard. °· Your swelling gets worse. °MAKE SURE YOU:  °· Understand these instructions. °· Will watch your condition. °· Will get help right away if you are not doing well or get worse. °Document Released: 07/26/2011 Document Revised: 02/21/2012 Document Reviewed: 07/26/2011 °ExitCare® Patient Information ©2014 [employer]. ° °

## 2014-05-04 NOTE — ED Notes (Signed)
Caugh left hand in door on Thursday  Increased pain and swelling

## 2014-05-04 NOTE — ED Notes (Signed)
inj to left hand in door on Thursday  Increased pain and swelling

## 2014-05-04 NOTE — ED Provider Notes (Signed)
CSN: [id number]     Arrival date & time 05/04/14  0122 History   None    Chief Complaint  Patient presents with  . Hand Injury     (Consider location/radiation/quality/duration/timing/severity/associated sxs/prior Treatment) Patient is a 28 y.o. male presenting with hand injury. The history is provided by the patient. No language interpreter was used.  Hand Injury Location:  Hand Injury: yes   Mechanism of injury comment:  Hit with a door Hand location:  L hand Pain details:    Quality:  Dull   Radiates to:  Does not radiate   Severity:  Moderate   Onset quality:  Sudden   Timing:  Constant   Progression:  Unchanged Chronicity:  New Foreign body present:  No foreign bodies Relieved by:  Nothing Worsened by:  Nothing tried Ineffective treatments:  NSAIDs (took one dose this evening) Associated symptoms: no numbness   Risk factors: no frequent fractures     Past Medical History  Diagnosis Date  . Gout    History reviewed. No pertinent past surgical history. Family History  Problem Relation Age of Onset  . Gout Paternal Grandmother   . Gout Paternal Grandfather    History  Substance Use Topics  . Smoking status: Never Smoker   . Smokeless tobacco: Never Used  . Alcohol Use: Yes     Comment: occasional     Review of Systems  All other systems reviewed and are negative.     Allergies  Review of patient's allergies indicates no known allergies.  Home Medications   Prior to Admission medications   Medication Sig Start Date End Date Taking? Authorizing Provider  colchicine 0.6 MG tablet Take 0.6 mg by mouth daily.    Historical Provider, MD  indomethacin (INDOCIN) 50 MG capsule Take 1 capsule (50 mg total) by mouth 3 (three) times daily with meals. 06/06/13   Eleanore Delia ChimesE Egan, PA-C  oxyCODONE-acetaminophen (PERCOCET/ROXICET) 5-325 MG per tablet Take 1 tablet by mouth every 8 (eight) hours as needed for severe pain. 11/12/13   Thao P Le, DO  predniSONE  (DELTASONE) 20 MG tablet Take 1 tablet (20 mg total) by mouth daily with breakfast. Take 3 tabs po daily x 3 days, then 2 tabs x 3 days, then 1 tab x 3 days, then 1/2 tab x 3 days 11/12/13   Thao P Le, DO   There were no vitals taken for this visit. Physical Exam  Constitutional: He is oriented to person, place, and time. He appears well-developed and well-nourished. No distress.  HENT:  Head: Normocephalic and atraumatic.  Mouth/Throat: Oropharynx is clear and moist.  Eyes: Conjunctivae and EOM are normal.  Neck: Normal range of motion. Neck supple.  Cardiovascular: Normal rate, regular rhythm and intact distal pulses.   Pulmonary/Chest: Effort normal and breath sounds normal. He has no wheezes. He has no rales.  Abdominal: Soft. Bowel sounds are normal. There is no tenderness. There is no rebound.  Musculoskeletal: Normal range of motion. He exhibits no edema.  NVI left hand FROM cap refill < 2 sec  Neurological: He is alert and oriented to person, place, and time. He has normal reflexes.  Skin: Skin is warm and dry.  Psychiatric: He has a normal mood and affect.    ED Course  Procedures (including critical care time) Labs Review Labs Reviewed - No data to display  Imaging Review No results found.   EKG Interpretation None      MDM   Final diagnoses:  None    Hand contusion, ice elevation and NSAIDS    Orest Dygert K Ilamae Geng-Rasch, MD 05/04/14 2482

## 2014-05-25 ENCOUNTER — Emergency Department (HOSPITAL_BASED_OUTPATIENT_CLINIC_OR_DEPARTMENT_OTHER): Payer: BC Managed Care – PPO

## 2014-05-25 ENCOUNTER — Encounter (HOSPITAL_BASED_OUTPATIENT_CLINIC_OR_DEPARTMENT_OTHER): Payer: Self-pay | Admitting: Emergency Medicine

## 2014-05-25 ENCOUNTER — Emergency Department (HOSPITAL_BASED_OUTPATIENT_CLINIC_OR_DEPARTMENT_OTHER)
Admission: EM | Admit: 2014-05-25 | Discharge: 2014-05-25 | Disposition: A | Discharge status: Home / Self Care | Payer: BC Managed Care – PPO | Attending: Emergency Medicine | Admitting: Emergency Medicine

## 2014-05-25 DIAGNOSIS — Y9389 Activity, other specified: Secondary | ICD-10-CM | POA: Insufficient documentation

## 2014-05-25 DIAGNOSIS — W010XXA Fall on same level from slipping, tripping and stumbling without subsequent striking against object, initial encounter: Secondary | ICD-10-CM | POA: Insufficient documentation

## 2014-05-25 DIAGNOSIS — Y929 Unspecified place or not applicable: Secondary | ICD-10-CM | POA: Insufficient documentation

## 2014-05-25 DIAGNOSIS — Z79899 Other long term (current) drug therapy: Secondary | ICD-10-CM | POA: Insufficient documentation

## 2014-05-25 DIAGNOSIS — S93409A Sprain of unspecified ligament of unspecified ankle, initial encounter: Secondary | ICD-10-CM | POA: Insufficient documentation

## 2014-05-25 DIAGNOSIS — S93401A Sprain of unspecified ligament of right ankle, initial encounter: Secondary | ICD-10-CM

## 2014-05-25 DIAGNOSIS — X500XXA Overexertion from strenuous movement or load, initial encounter: Secondary | ICD-10-CM | POA: Insufficient documentation

## 2014-05-25 DIAGNOSIS — M109 Gout, unspecified: Secondary | ICD-10-CM | POA: Insufficient documentation

## 2014-05-25 MED ORDER — HYDROCODONE-ACETAMINOPHEN 5-325 MG PO TABS: 1.0000 | ORAL_TABLET | ORAL | Status: DC | PRN

## 2014-05-25 MED ORDER — IBUPROFEN 800 MG PO TABS
800.0000 mg | ORAL_TABLET | Freq: Once | ORAL | Status: AC
  Administered 2014-05-25: 800 mg via ORAL
  Filled 2014-05-25: qty 1

## 2014-05-25 MED ORDER — OXYCODONE-ACETAMINOPHEN 5-325 MG PO TABS
1.0000 | ORAL_TABLET | Freq: Once | ORAL | Status: AC
  Administered 2014-05-25: 1 via ORAL
  Filled 2014-05-25: qty 1

## 2014-05-25 MED ORDER — IBUPROFEN 600 MG PO TABS: 600.0000 mg | ORAL_TABLET | Freq: Three times a day (TID) | ORAL | Status: DC | PRN

## 2014-05-25 NOTE — Discharge Instructions (Signed)

## 2014-05-25 NOTE — ED Provider Notes (Signed)
CSN: [id number]     Arrival date & time 05/25/14  0735 History   First MD Initiated Contact with Patient 05/25/14 [phone]     Chief Complaint  Patient presents with  . Ankle Injury     HPI Patient reports injury with an inversion injury to his right ankle approximately 3 and half days ago.  He reinjured his right ankle last night while in the shower.  He reports pain to the lateral aspect of his right ankle and painful ambulation.  He does have a history of gout and is concerned this could be gout as well.  He's had no fevers or chills.  He is no other complaints.  He has been able to walk on his right ankle since the event.   Past Medical History  Diagnosis Date  . Gout    History reviewed. No pertinent past surgical history. Family History  Problem Relation Age of Onset  . Gout Paternal Grandmother   . Gout Paternal Grandfather    History  Substance Use Topics  . Smoking status: Never Smoker   . Smokeless tobacco: Never Used  . Alcohol Use: Yes     Comment: occasional     Review of Systems  All other systems reviewed and are negative.     Allergies  Review of patient's allergies indicates no known allergies.  Home Medications   Prior to Admission medications   Medication Sig Start Date End Date Taking? Authorizing Provider  allopurinol (ZYLOPRIM) 100 MG tablet Take 100 mg by mouth daily.   Yes Historical Provider, MD  colchicine 0.6 MG tablet Take 0.6 mg by mouth daily.    Historical Provider, MD  HYDROcodone-acetaminophen (NORCO/VICODIN) 5-325 MG per tablet Take 1 tablet by mouth every 4 (four) hours as needed for moderate pain. 05/25/14   Lyanne CoKevin M Chick Cousins, MD  ibuprofen (ADVIL,MOTRIN) 600 MG tablet Take 1 tablet (600 mg total) by mouth every 8 (eight) hours as needed. 05/25/14   Lyanne CoKevin M Vianca Bracher, MD   BP 174/105  Pulse 70  Temp(Src) 100.1 F (37.8 C) (Oral)  Resp 20  Ht 6\' 2"  (1.88 m)  Wt 275 lb (124.739 kg)  BMI 35.29 kg/m2  SpO2 96% Physical Exam  Nursing note  and vitals reviewed. Constitutional: He is oriented to person, place, and time. He appears well-developed and well-nourished.  HENT:  Head: Normocephalic.  Eyes: EOM are normal.  Neck: Normal range of motion.  Pulmonary/Chest: Effort normal.  Abdominal: He exhibits no distension.  Musculoskeletal: Normal range of motion.  Mild tenderness of his right lateral malleolus.  Mild tenderness of his right medial malleolus.  Normal PT and DP pulses right foot.  Swelling of the lateral aspect of his right ankle.  No tenderness along his Achilles.  Patient has plantar flexion with squeezing of his calf.  Neurological: He is alert and oriented to person, place, and time.  Psychiatric: He has a normal mood and affect.    ED Course  Procedures (including critical care time) Labs Review Labs Reviewed - No data to display  Imaging Review Dg Ankle Complete Right  05/25/2014   CLINICAL DATA:  Twisted right ankle.  EXAM: RIGHT ANKLE - COMPLETE 3+ VIEW  COMPARISON:  07/20/2009  FINDINGS: There is no evidence of fracture, dislocation, or joint effusion. There is no evidence of arthropathy or other focal bone abnormality. Soft tissues are unremarkable.  IMPRESSION: Negative.   Electronically Signed   By: Signa Kellaylor  Stroud M.D.   On: 05/25/2014 08:17  I personally reviewed the imaging tests through PACS system I reviewed available ER/hospitalization records through the EMR    EKG Interpretation None      MDM   Final diagnoses:  Right ankle sprain     Followup with sports medicine if not improving.    Lyanne CoKevin M Lilymae Swiech, MD 05/25/14 [phone]

## 2014-05-25 NOTE — ED Notes (Signed)
Patient here with right ankle pain x 3 days. Reports that he twisted right ankle Wednesday while walking up steps and then twisted it again yesterday after slipping in shower. Pain with ambulation.

## 2014-05-25 NOTE — ED Notes (Signed)
Patient transported to X-ray 

## 2014-05-29 ENCOUNTER — Ambulatory Visit: Payer: BC Managed Care – PPO | Admitting: Family Medicine

## 2016-12-01 DIAGNOSIS — E782 Mixed hyperlipidemia: Secondary | ICD-10-CM | POA: Diagnosis present

## 2016-12-01 DIAGNOSIS — E1129 Type 2 diabetes mellitus with other diabetic kidney complication: Secondary | ICD-10-CM

## 2016-12-01 DIAGNOSIS — Z794 Long term (current) use of insulin: Secondary | ICD-10-CM

## 2017-10-18 ENCOUNTER — Emergency Department (HOSPITAL_COMMUNITY): Payer: Self-pay

## 2017-10-18 ENCOUNTER — Emergency Department (HOSPITAL_COMMUNITY)
Admission: EM | Admit: 2017-10-18 | Discharge: 2017-10-18 | Disposition: A | Discharge status: Home / Self Care | Payer: Self-pay | Attending: Emergency Medicine | Admitting: Emergency Medicine

## 2017-10-18 ENCOUNTER — Telehealth: Payer: Self-pay | Admitting: Emergency Medicine

## 2017-10-18 DIAGNOSIS — M109 Gout, unspecified: Secondary | ICD-10-CM | POA: Insufficient documentation

## 2017-10-18 LAB — CBC WITH DIFFERENTIAL/PLATELET
Basophils Absolute: 0 10*3/uL (ref 0.0–0.1)
Basophils Relative: 0 %
Eosinophils Absolute: 0 10*3/uL (ref 0.0–0.7)
Eosinophils Relative: 0 %
HEMATOCRIT: 35.7 % — AB (ref 39.0–52.0)
Hemoglobin: 12.5 g/dL — ABNORMAL LOW (ref 13.0–17.0)
LYMPHS ABS: 1 10*3/uL (ref 0.7–4.0)
LYMPHS PCT: 9 %
MCH: 25.7 pg — AB (ref 26.0–34.0)
MCHC: 35 g/dL (ref 30.0–36.0)
MCV: 73.5 fL — ABNORMAL LOW (ref 78.0–100.0)
MONOS PCT: 7 %
Monocytes Absolute: 0.8 10*3/uL (ref 0.1–1.0)
NEUTROS PCT: 84 %
Neutro Abs: 8.9 10*3/uL — ABNORMAL HIGH (ref 1.7–7.7)
Platelets: 321 10*3/uL (ref 150–400)
RBC: 4.86 MIL/uL (ref 4.22–5.81)
RDW: 15.1 % (ref 11.5–15.5)
WBC: 10.6 10*3/uL — AB (ref 4.0–10.5)

## 2017-10-18 LAB — URINALYSIS, ROUTINE W REFLEX MICROSCOPIC
BILIRUBIN URINE: NEGATIVE
Glucose, UA: NEGATIVE mg/dL
HGB URINE DIPSTICK: NEGATIVE
Ketones, ur: NEGATIVE mg/dL
Leukocytes, UA: NEGATIVE
Nitrite: NEGATIVE
Protein, ur: >=300 mg/dL — AB
SPECIFIC GRAVITY, URINE: 1.019 (ref 1.005–1.030)
pH: 6 (ref 5.0–8.0)

## 2017-10-18 LAB — COMPREHENSIVE METABOLIC PANEL
ALT: 24 U/L (ref 17–63)
AST: 18 U/L (ref 15–41)
Albumin: 3.5 g/dL (ref 3.5–5.0)
Alkaline Phosphatase: 71 U/L (ref 38–126)
Anion gap: 11 (ref 5–15)
BUN: 11 mg/dL (ref 6–20)
CO2: 28 mmol/L (ref 22–32)
Calcium: 9.3 mg/dL (ref 8.9–10.3)
Chloride: 97 mmol/L — ABNORMAL LOW (ref 101–111)
Creatinine, Ser: 1.09 mg/dL (ref 0.61–1.24)
Glucose, Bld: 148 mg/dL — ABNORMAL HIGH (ref 65–99)
POTASSIUM: 3.9 mmol/L (ref 3.5–5.1)
Sodium: 136 mmol/L (ref 135–145)
Total Bilirubin: 0.8 mg/dL (ref 0.3–1.2)
Total Protein: 8.5 g/dL — ABNORMAL HIGH (ref 6.5–8.1)

## 2017-10-18 LAB — I-STAT CG4 LACTIC ACID, ED: Lactic Acid, Venous: 0.99 mmol/L (ref 0.5–1.9)

## 2017-10-18 MED ORDER — HYDROCODONE-ACETAMINOPHEN 5-325 MG PO TABS: 2.0000 | ORAL_TABLET | ORAL | 0 refills | Status: DC | PRN

## 2017-10-18 MED ORDER — PREDNISONE 10 MG PO TABS: ORAL_TABLET | ORAL | 0 refills | Status: DC

## 2017-10-18 MED ORDER — FENTANYL CITRATE (PF) 100 MCG/2ML IJ SOLN
100.0000 ug | INTRAMUSCULAR | Status: DC | PRN
  Administered 2017-10-18 (×2): 100 ug via INTRAVENOUS
  Filled 2017-10-18 (×2): qty 2

## 2017-10-18 MED ORDER — ACETAMINOPHEN 500 MG PO TABS
1000.0000 mg | ORAL_TABLET | Freq: Once | ORAL | Status: AC
  Administered 2017-10-18: 1000 mg via ORAL
  Filled 2017-10-18: qty 2

## 2017-10-18 MED ORDER — ONDANSETRON HCL 4 MG/2ML IJ SOLN
4.0000 mg | Freq: Once | INTRAMUSCULAR | Status: AC
  Administered 2017-10-18: 4 mg via INTRAVENOUS
  Filled 2017-10-18: qty 2

## 2017-10-18 MED ORDER — COLCHICINE 0.6 MG PO TABS: ORAL_TABLET | ORAL | 3 refills | Status: DC

## 2017-10-18 NOTE — ED Provider Notes (Signed)
[facility]-EMERGENCY DEPT Provider Note   CSN: [id number] Arrival date & time: 10/18/17  [id number]     History   Chief Complaint Chief Complaint  Patient presents with  . Joint Swelling    HPI Christopher Hays is a 31 y.o. male.  He complains of pain and swelling in his left knee for several days.  There is been no known trauma.  He was recently treated for bilateral lower extremity edema with furosemide, initiated 2 weeks ago and is taking daily.  He feels like the swelling in his legs and feet are somewhat better since then.  He also was treated with prednisone at that time, for unknown reason.  He denies any recent evaluations or treatment for gout.  He felt some chills last night but did not take his temperature.  He denies back pain, nausea, vomiting, weakness or dizziness.  The pain in his left knee is severe.  There are no other known modifying factors.  HPI  Past Medical History:  Diagnosis Date  . Gout     Patient Active Problem List   Diagnosis Date Noted  . Gout 06/06/2013    No past surgical history on file.     Home Medications    Prior to Admission medications   Medication Sig Start Date End Date Taking? Authorizing Provider  furosemide (LASIX) 20 MG tablet Take 20 mg daily by mouth. 09/30/17 10/30/17 Yes [provider]  colchicine 0.6 MG tablet 1 every 4 hours as needed gout pain 10/18/17   Mancel BaleWentz, Donalyn Schneeberger, MD  HYDROcodone-acetaminophen (NORCO) 5-325 MG tablet Take 2 tablets every 4 (four) hours as needed by mouth. 10/18/17   Mancel BaleWentz, Iden Stripling, MD  predniSONE (DELTASONE) 10 MG tablet Take q day 6,6,5,5,4,4,3,3,2,2,1,1 10/18/17   Mancel BaleWentz, Aniello Christopoulos, MD    Family History Family History  Problem Relation Age of Onset  . Gout Paternal Grandmother   . Gout Paternal Grandfather     Social History Social History   Tobacco Use  . Smoking status: Never Smoker  . Smokeless tobacco: Never Used  Substance Use Topics  . Alcohol use: Yes   Comment: occasional   . Drug use: No     Allergies   Patient has no known allergies.   Review of Systems Review of Systems  All other systems reviewed and are negative.    Physical Exam Updated Vital Signs BP (!) 154/84   Pulse (!) 102   Temp 99.1 F (37.3 C) (Oral)   Resp 18   SpO2 95%   Physical Exam  Constitutional: He is oriented to person, place, and time. He appears well-developed and well-nourished. He appears distressed (He is uncomfortable).  HENT:  Head: Normocephalic and atraumatic.  Right Ear: External ear normal.  Left Ear: External ear normal.  Eyes: Conjunctivae and EOM are normal. Pupils are equal, round, and reactive to light.  Neck: Normal range of motion and phonation normal. Neck supple.  Cardiovascular: Normal rate.  Pulmonary/Chest: Effort normal. He exhibits no bony tenderness.  Abdominal: Soft. There is no tenderness.  Musculoskeletal:  Left knee held flexed at about 60 degrees.  There is a large left knee effusion.  Left knee is exquisitely tender to light touch.  Patient resists any flexion or extension of the left knee secondary to pain.  There is no overlying erythema or warmth, associated with the left knee swelling.  Neurological: He is alert and oriented to person, place, and time. No cranial nerve deficit or sensory deficit. He exhibits  normal muscle tone. Coordination normal.  Skin: Skin is warm, dry and intact.  Psychiatric: He has a normal mood and affect. His behavior is normal. Judgment and thought content normal.  Nursing note and vitals reviewed.    ED Treatments / Results  Labs (all labs ordered are listed, but only abnormal results are displayed) Labs Reviewed  COMPREHENSIVE METABOLIC PANEL - Abnormal; Notable for the following components:      Result Value   Chloride 97 (*)    Glucose, Bld 148 (*)    Total Protein 8.5 (*)    All other components within normal limits  CBC WITH DIFFERENTIAL/PLATELET - Abnormal; Notable for  the following components:   WBC 10.6 (*)    Hemoglobin 12.5 (*)    HCT 35.7 (*)    MCV 73.5 (*)    MCH 25.7 (*)    Neutro Abs 8.9 (*)    All other components within normal limits  URINALYSIS, ROUTINE W REFLEX MICROSCOPIC - Abnormal; Notable for the following components:   Protein, ur >=300 (*)    Bacteria, UA RARE (*)    Squamous Epithelial / LPF 0-5 (*)    All other components within normal limits  I-STAT CG4 LACTIC ACID, ED  I-STAT CG4 LACTIC ACID, ED    EKG  EKG Interpretation None       Radiology Dg Chest 2 View  Result Date: 10/18/2017 CLINICAL DATA:  Gout with severe pain and fluid retention in the left leg. No shortness of breath, cough, or chest discomfort. EXAM: CHEST  2 VIEW COMPARISON:  None in PACs FINDINGS: The lungs are well-expanded and clear. The cardiac silhouette is enlarged. The pulmonary vascularity is not engorged. There is no pleural effusion. The bony thorax is unremarkable. IMPRESSION: Mild cardiac silhouette enlargement without evidence of pulmonary edema or other acute cardiopulmonary abnormality. Electronically Signed   By: David  SwazilandJordan M.D.   On: 10/18/2017 11:14   Dg Knee Complete 4 Views Left  Result Date: 10/18/2017 CLINICAL DATA:  Left knee pain.  No known injury. EXAM: LEFT KNEE - COMPLETE 4+ VIEW COMPARISON:  None. FINDINGS: No evidence of fracture, or dislocation. There is a large suprapatellar joint effusion. Mild 3 compartment osteoarthritic changes. IMPRESSION: Large suprapatellar joint effusion. Mild 3 compartment osteoarthritic changes. Electronically Signed   By: Ted Mcalpineobrinka  Dimitrova M.D.   On: 10/18/2017 13:11    Procedures Procedures (including critical care time)  Medications Ordered in ED Medications  fentaNYL (SUBLIMAZE) injection 100 mcg (100 mcg Intravenous Given 10/18/17 1300)  acetaminophen (TYLENOL) tablet 1,000 mg (1,000 mg Oral Given 10/18/17 1055)  ondansetron (ZOFRAN) injection 4 mg (4 mg Intravenous Given 10/18/17 1055)      Initial Impression / Assessment and Plan / ED Course  I have reviewed the triage vital signs and the nursing notes.  Pertinent labs & imaging results that were available during my care of the patient were reviewed by me and considered in my medical decision making (see chart for details).  Clinical Course as of Oct 18 1414  Tue Oct 18, 2017  [id number] Elevated Temp: (!) 101.7 F (38.7 C) [EW]    Clinical Course User Index [EW] Mancel BaleWentz, Gareld Obrecht, MD     Patient Vitals for the past 24 hrs:  BP Temp Temp src Pulse Resp SpO2  10/18/17 1340 - 99.1 F (37.3 C) Oral - - -  10/18/17 1300 (!) 154/84 - - (!) 102 18 95 %  10/18/17 0935 125/70 (!) 101.7 F (38.7 C) Oral Marland Kitchen(!)  103 18 95 %  10/18/17 0931 - - - - - 98 %    1:37 PM Reevaluation with update and discussion. After initial assessment and treatment, an updated evaluation reveals patient is more comfortable at this time.  Findings discussed with the patient, and his family members, all questions were answered. Robynne Roat L      Final Clinical Impressions(s) / ED Diagnoses   Final diagnoses:  Acute gout of left knee, unspecified cause    Evaluation is consistent with recurrent gout, left knee at this time.  Patient is not currently taking suppressant medication, to prevent gout.  Doubt septic arthritis, fracture, tendinitis.  Risk of iatrogenic infection, does not justify drainage, of the knee at this time, which would be at best a transient solution for pain.  Patient treated symptomatically with knee immobilizer, and medications ordered to treat gout.  Nursing Notes Reviewed/ Care Coordinated Applicable Imaging Reviewed Interpretation of Laboratory Data incorporated into ED treatment  The patient appears reasonably screened and/or stabilized for discharge and I doubt any other medical condition or other [facility] requiring further screening, evaluation, or treatment in the ED at this time prior to discharge.  Plan: Home  Medications-OTC analgesic and antipyretic as needed; Home Treatments-knee immobilizer for comfort.; return here if the recommended treatment, does not improve the symptoms; Recommended follow up-PCP follow-up for further evaluation and treatment.   ED Discharge Orders        Ordered    HYDROcodone-acetaminophen (NORCO) 5-325 MG tablet  Every 4 hours PRN     10/18/17 1412    predniSONE (DELTASONE) 10 MG tablet     10/18/17 1412    colchicine 0.6 MG tablet     10/18/17 1412       Mancel Bale, MD 10/18/17 1416

## 2017-10-18 NOTE — ED Triage Notes (Signed)
Transported by [facility] from home. Swelling/pain to left knee with a history of gout. Patient was recently prescribed Prednisone and Lasix, and ran out of Prednisone a few days ago. Unable to bear weight, pain 10/10.

## 2017-10-18 NOTE — Discharge Instructions (Signed)
Wear the knee immobilizer as needed for comfort.  Take the prescribed medications, to treat gout.  You may need to take something additional for fever.  Each pain pill has 325 mg of Tylenol in it.  You can take up to 650 mg of Tylenol every 4 hours for fever.

## 2017-10-18 NOTE — Telephone Encounter (Signed)
Received a call from [place] with [facility] stating the Norco prescribed for the pt by Dr. Effie ShyWentz today at 2 tablets every 4 hours was too high of a dose for a 24 hour period.  They requested to change it to 1 tablet every 4 hours.  Confirmed this was ok.  No further CM needs noted at this time.

## 2017-10-18 NOTE — ED Notes (Signed)
Patient transported to X-ray 

## 2017-10-18 NOTE — ED Notes (Signed)
Pt and family request to speak to EDP one more time before discharge.  Made MD aware. Discharge delayed.

## 2017-10-18 NOTE — ED Notes (Signed)
Bed: WTR7 Expected date:  Expected time:  Means of arrival:  Comments: 

## 2017-10-30 ENCOUNTER — Emergency Department (HOSPITAL_COMMUNITY)
Admission: EM | Admit: 2017-10-30 | Discharge: 2017-10-30 | Disposition: A | Discharge status: Home / Self Care | Payer: Self-pay | Attending: Emergency Medicine | Admitting: Emergency Medicine

## 2017-10-30 ENCOUNTER — Other Ambulatory Visit: Payer: Self-pay

## 2017-10-30 ENCOUNTER — Encounter (HOSPITAL_COMMUNITY): Payer: Self-pay

## 2017-10-30 DIAGNOSIS — I10 Essential (primary) hypertension: Secondary | ICD-10-CM | POA: Insufficient documentation

## 2017-10-30 DIAGNOSIS — M5431 Sciatica, right side: Secondary | ICD-10-CM | POA: Insufficient documentation

## 2017-10-30 MED ORDER — CYCLOBENZAPRINE HCL 10 MG PO TABS: 10.0000 mg | ORAL_TABLET | Freq: Two times a day (BID) | ORAL | 0 refills | Status: DC | PRN

## 2017-10-30 NOTE — ED Triage Notes (Signed)
Pt reports R leg and foot pain/numbness. He states that it started in his back and now shoots down his buttocks and R leg to his foot. He also hypertensive. He states that he is on blood pressure medication and has been compliant. A&Ox4. Ambulatory,.

## 2017-10-30 NOTE — Discharge Instructions (Signed)
Continue taking Motrin to help with pain and swelling. Use ice or heat for symptom control. Use the muscle relaxer as needed for pain.  Use this at night, as it may make you tired or groggy.  Do not drive or operate heavy machinery while you are taking this medicine. It is very important that you establish care with primary care.  You may contact [facility] and wellness for this. Return to the emergency room if you develop fevers, chills, loss of bowel or bladder control, urinary symptoms, or any new or worsening symptoms.

## 2017-10-30 NOTE — ED Provider Notes (Signed)
[facility]-EMERGENCY DEPT Provider Note   CSN: [id number] Arrival date & time: 10/30/17  1122     History   Chief Complaint Chief Complaint  Patient presents with  . Leg Pain    R  . Back Pain  . Hypertension    HPI Christopher Hays is a 31 y.o. male presenting with right sided back and leg pain.  Patient states that his pain started on Sunday when he was going from sitting to standing.  Initially he had a lot of low back pain, which is now resolved.  He reports continued pain of the buttock radiating down the back of his leg.  The pain is described as a burning, tingling, numbness.  He has a history of sciatica, which he states feels similar.  He has been taking Motrin without improvement of the pain.  Standing and walking makes the pain worse, nothing has made it better.  He denies pain on the other side.  He currently denies back pain.  He denies fevers, chills, history of IV drug use, history of cancer, loss of bowel or bladder control.  He denies fall, trauma, or injury.  He states he is still on the prednisone prescribed in for his knee pain last week. Additionally, patient states he has been having higher than normal blood pressure over the past week.  He just restarted his medication a few days ago.  He denies vision changes, confusion, chest pain, shortness of breath, flank pain, or unilateral weakness.   HPI  Past Medical History:  Diagnosis Date  . Gout     Patient Active Problem List   Diagnosis Date Noted  . Gout 06/06/2013    History reviewed. No pertinent surgical history.     Home Medications    Prior to Admission medications   Medication Sig Start Date End Date Taking? Authorizing Provider  amLODipine (NORVASC) 5 MG tablet Take 5 mg daily by mouth.   Yes [provider]  HYDROcodone-acetaminophen (NORCO) 5-325 MG tablet Take 2 tablets every 4 (four) hours as needed by mouth. 10/18/17  Yes Mancel BaleWentz, Elliott, MD  predniSONE  (DELTASONE) 10 MG tablet Take q day 6,6,5,5,4,4,3,3,2,2,1,1 10/18/17  Yes Mancel BaleWentz, Elliott, MD  colchicine 0.6 MG tablet 1 every 4 hours as needed gout pain Patient not taking: Reported on 10/30/2017 10/18/17   Mancel BaleWentz, Elliott, MD  cyclobenzaprine (FLEXERIL) 10 MG tablet Take 1 tablet (10 mg total) 2 (two) times daily as needed by mouth for muscle spasms. 10/30/17   Charles Andringa, PA-C    Family History Family History  Problem Relation Age of Onset  . Gout Paternal Grandmother   . Gout Paternal Grandfather     Social History Social History   Tobacco Use  . Smoking status: Never Smoker  . Smokeless tobacco: Never Used  Substance Use Topics  . Alcohol use: Yes    Comment: occasional   . Drug use: No     Allergies   Patient has no known allergies.   Review of Systems Review of Systems  Musculoskeletal: Positive for back pain.  Skin: Negative for wound.  Hematological: Does not bruise/bleed easily.     Physical Exam Updated Vital Signs BP (!) 144/96 (BP Location: Right Arm)   Pulse 90   Temp 98.7 F (37.1 C) (Oral)   Resp 18   SpO2 99%   Physical Exam  Constitutional: He is oriented to person, place, and time. He appears well-developed and well-nourished. No distress.  HENT:  Head: Normocephalic  and atraumatic.  Nose: Nose normal.  Mouth/Throat: Uvula is midline, oropharynx is clear and moist and mucous membranes are normal.  Eyes: Conjunctivae and EOM are normal. Pupils are equal, round, and reactive to light.  Neck: Normal range of motion.  Cardiovascular: Normal rate, regular rhythm and intact distal pulses.  Pulmonary/Chest: Effort normal and breath sounds normal. No respiratory distress. He has no wheezes.  Abdominal: Soft. He exhibits no distension. There is no tenderness. There is no guarding.  Musculoskeletal: Normal range of motion. He exhibits tenderness. He exhibits no edema or deformity.  Tenderness to palpation of right SI joint.  No tenderness  palpation of back musculature or midline spine.  Strength of lower extremity is intact bilaterally.  Sensation intact bilaterally.  Pedal pulses equal bilaterally.  Patient is ambulatory.  Soft compartments.  Neurological: He is alert and oriented to person, place, and time.  Skin: Skin is warm and dry.  Psychiatric: He has a normal mood and affect.  Nursing note and vitals reviewed.    ED Treatments / Results  Labs (all labs ordered are listed, but only abnormal results are displayed) Labs Reviewed - No data to display  EKG  EKG Interpretation None       Radiology No results found.  Procedures Procedures (including critical care time)  Medications Ordered in ED Medications - No data to display   Initial Impression / Assessment and Plan / ED Course  I have reviewed the triage vital signs and the nursing notes.  Pertinent labs & imaging results that were available during my care of the patient were reviewed by me and considered in my medical decision making (see chart for details).     Patient presenting for evaluation of right sided low back/leg pain.  Neurovascularly intact.  No red flags of back pain.  [facility], following the sciatic nerve.  Likely sciatica.  Will treat with muscle relaxer and NSAIDs.  Additionally, patient concerned about his blood pressure.  Initial blood pressure elevated, but improved throughout visit without intervention.  No signs of endorgan damage including vision changes, encephalopathy, or chest pain.  Discussed importance of primary care follow-up.  [facility].  At this time, patient appears safe for discharge.  Return precautions given.  Patient states he understands and agrees to plan.   Final Clinical Impressions(s) / ED Diagnoses   Final diagnoses:  Sciatica of right side    ED Discharge Orders        Ordered    cyclobenzaprine (FLEXERIL) 10 MG tablet  2 times daily PRN     10/30/17  1611       Floyed Masoud, PA-C 10/30/17 2306    Mancel BaleWentz, Elliott, MD 10/31/17 0900

## 2018-01-27 ENCOUNTER — Other Ambulatory Visit: Payer: Self-pay | Admitting: Family Medicine

## 2018-01-27 DIAGNOSIS — R809 Proteinuria, unspecified: Secondary | ICD-10-CM

## 2018-07-08 ENCOUNTER — Other Ambulatory Visit: Payer: Self-pay

## 2018-07-08 ENCOUNTER — Emergency Department (HOSPITAL_COMMUNITY): Payer: 59

## 2018-07-08 ENCOUNTER — Emergency Department (HOSPITAL_COMMUNITY)
Admission: EM | Admit: 2018-07-08 | Discharge: 2018-07-08 | Disposition: A | Discharge status: Home / Self Care | Payer: 59 | Attending: Emergency Medicine | Admitting: Emergency Medicine

## 2018-07-08 ENCOUNTER — Encounter (HOSPITAL_COMMUNITY): Payer: Self-pay | Admitting: Emergency Medicine

## 2018-07-08 DIAGNOSIS — M940 Chondrocostal junction syndrome [Tietze]: Secondary | ICD-10-CM | POA: Insufficient documentation

## 2018-07-08 DIAGNOSIS — R0682 Tachypnea, not elsewhere classified: Secondary | ICD-10-CM | POA: Insufficient documentation

## 2018-07-08 DIAGNOSIS — R0789 Other chest pain: Secondary | ICD-10-CM | POA: Diagnosis present

## 2018-07-08 DIAGNOSIS — Z79899 Other long term (current) drug therapy: Secondary | ICD-10-CM | POA: Insufficient documentation

## 2018-07-08 DIAGNOSIS — I10 Essential (primary) hypertension: Secondary | ICD-10-CM | POA: Diagnosis not present

## 2018-07-08 DIAGNOSIS — R000 Tachycardia, unspecified: Secondary | ICD-10-CM | POA: Diagnosis not present

## 2018-07-08 HISTORY — DX: Essential (primary) hypertension: I10

## 2018-07-08 LAB — BASIC METABOLIC PANEL
Anion gap: 16 — ABNORMAL HIGH (ref 5–15)
BUN: 16 mg/dL (ref 6–20)
CHLORIDE: 101 mmol/L (ref 98–111)
CO2: 24 mmol/L (ref 22–32)
CREATININE: 1.06 mg/dL (ref 0.61–1.24)
Calcium: 9.8 mg/dL (ref 8.9–10.3)
GFR calc Af Amer: >60 mL/min (ref >60)
GFR calc non Af Amer: >60 mL/min (ref >60)
GLUCOSE: 206 mg/dL — AB (ref 70–99)
Potassium: 4.2 mmol/L (ref 3.5–5.1)
Sodium: 141 mmol/L (ref 135–145)

## 2018-07-08 LAB — CBC
HEMATOCRIT: 41.8 % (ref 39.0–52.0)
HEMOGLOBIN: 13.9 g/dL (ref 13.0–17.0)
MCH: 26.3 pg (ref 26.0–34.0)
MCHC: 33.3 g/dL (ref 30.0–36.0)
MCV: 79.2 fL (ref 78.0–100.0)
Platelets: 333 10*3/uL (ref 150–400)
RBC: 5.28 MIL/uL (ref 4.22–5.81)
RDW: 14.6 % (ref 11.5–15.5)
WBC: 7.9 10*3/uL (ref 4.0–10.5)

## 2018-07-08 LAB — I-STAT TROPONIN, ED
TROPONIN I, POC: 0.03 ng/mL (ref 0.00–0.08)
Troponin i, poc: 0.04 ng/mL (ref 0.00–0.08)

## 2018-07-08 LAB — D-DIMER, QUANTITATIVE (NOT AT ARMC): D DIMER QUANT: 1.05 ug{FEU}/mL — AB (ref 0.00–0.50)

## 2018-07-08 MED ORDER — IOPAMIDOL (ISOVUE-370) INJECTION 76%
80.0000 mL | Freq: Once | INTRAVENOUS | Status: AC | PRN
  Administered 2018-07-08: 80 mL via INTRAVENOUS

## 2018-07-08 MED ORDER — IBUPROFEN 600 MG PO TABS: 600.0000 mg | ORAL_TABLET | Freq: Four times a day (QID) | ORAL | 0 refills | Status: DC | PRN

## 2018-07-08 MED ORDER — MORPHINE SULFATE (PF) 4 MG/ML IV SOLN
4.0000 mg | Freq: Once | INTRAVENOUS | Status: AC
  Administered 2018-07-08: 4 mg via INTRAVENOUS
  Filled 2018-07-08: qty 1

## 2018-07-08 NOTE — ED Provider Notes (Signed)
[facility]ENC[facility] Provider Note   CSN: [id number] Arrival date & time: 07/08/18  [id number]     History   Chief Complaint Chief Complaint  Patient presents with  . Chest Pain    HPI Christopher Hays is a 32 y.o. male.  The history is provided by the patient and medical records. No language interpreter was used.  Chest Pain       32 year old male who is not a smoker but with history of hypertension and gout presenting for evaluation of chest pain.  Patient report for the past week he has had pain to his back.  Pain is described as a sharp shooting sensation across his back now radiates towards his anterior chest.  He also endorsed increasing pain when he takes a deep breath, or when he is moving about.  Patient endorsed shortness of breath but he rates his pain as 8 out of 10 improves when he lays still.  He denies any associated fever, chills, lightheadedness, dizziness, diaphoresis, nausea, productive cough, hemoptysis, abdominal pain or rash.  He denies any recent strenuous activities or heavy lifting.  No prior history of PE or DVT, no recent surgery, prolonged bedrest, active cancer, hemoptysis, leg swelling or calf pain.  Aside from history of hypertension which she takes medication for, patient denies any family history of premature cardiac death.  He denies tobacco use or recreational drug use.  No specific treatment tried at home.  Past Medical History:  Diagnosis Date  . Gout   . Hypertension     Patient Active Problem List   Diagnosis Date Noted  . Gout 06/06/2013    History reviewed. No pertinent surgical history.      Home Medications    Prior to Admission medications   Medication Sig Start Date End Date Taking? Authorizing Provider  allopurinol (ZYLOPRIM) 300 MG tablet Take 300 mg by mouth daily. 06/29/18  Yes [provider]  amLODipine (NORVASC) 5 MG tablet Take 5 mg daily by mouth.   Yes [provider]  colchicine  0.6 MG tablet 1 every 4 hours as needed gout pain Patient not taking: Reported on 10/30/2017 10/18/17   Mancel BaleWentz, Elliott, MD  cyclobenzaprine (FLEXERIL) 10 MG tablet Take 1 tablet (10 mg total) 2 (two) times daily as needed by mouth for muscle spasms. Patient not taking: Reported on 07/08/2018 10/30/17   Caccavale, Sophia, PA-C  HYDROcodone-acetaminophen (NORCO) 5-325 MG tablet Take 2 tablets every 4 (four) hours as needed by mouth. Patient not taking: Reported on 07/08/2018 10/18/17   Mancel BaleWentz, Elliott, MD  predniSONE (DELTASONE) 10 MG tablet Take q day 6,6,5,5,4,4,3,3,2,2,1,1 Patient not taking: Reported on 07/08/2018 10/18/17   Mancel BaleWentz, Elliott, MD    Family History Family History  Problem Relation Age of Onset  . Gout Paternal Grandmother   . Gout Paternal Grandfather     Social History Social History   Tobacco Use  . Smoking status: Never Smoker  . Smokeless tobacco: Never Used  Substance Use Topics  . Alcohol use: Yes    Comment: occasional   . Drug use: No     Allergies   Patient has no known allergies.   Review of Systems Review of Systems  Cardiovascular: Positive for chest pain.  All other systems reviewed and are negative.    Physical Exam Updated Vital Signs BP (!) 158/112 (BP Location: Right Arm)   Pulse (!) 107   Temp 98.1 F (36.7 C) (Oral)   Resp 18   Ht  6' (1.829 m)   Wt (!) 156.5 kg (345 lb)   SpO2 99%   BMI 46.79 kg/m   Physical Exam  Constitutional: He is oriented to person, place, and time. He appears well-developed and well-nourished. No distress.  Obese male laying in bed in no acute discomfort.  HENT:  Head: Atraumatic.  Eyes: Conjunctivae are normal.  Neck: Neck supple.  Cardiovascular: Intact distal pulses and normal pulses.  Mild tachycardia without murmur rubs or gallops  Pulmonary/Chest: Effort normal. Tachypnea noted. He has no decreased breath sounds. He has no wheezes. He has no rhonchi. He has no rales. He exhibits tenderness (Mild  tenderness to the palpation of the anterior chest wall without any crepitus or emphysema or overlying skin changes.  Similar tenderness to upper back on palpation.).  Mild tachypnea  Abdominal: Soft. There is no tenderness.  Musculoskeletal: Normal range of motion.       Right lower leg: He exhibits no edema.       Left lower leg: He exhibits no edema.  Neurological: He is alert and oriented to person, place, and time.  Skin: Capillary refill takes less than 2 seconds. No rash noted.  Psychiatric: He has a normal mood and affect.  Nursing note and vitals reviewed.    ED Treatments / Results  Labs (all labs ordered are listed, but only abnormal results are displayed) Labs Reviewed  BASIC METABOLIC PANEL - Abnormal; Notable for the following components:      Result Value   Glucose, Bld 206 (*)    Anion gap 16 (*)    All other components within normal limits  D-DIMER, QUANTITATIVE (NOT AT [facility]) - Abnormal; Notable for the following components:   D-Dimer, Quant 1.05 (*)    All other components within normal limits  CBC  I-STAT TROPONIN, ED  I-STAT TROPONIN, ED    EKG EKG Interpretation  Date/Time:  Saturday July 08 2018 05:11:24 EDT Ventricular Rate:  95 PR Interval:  192 QRS Duration: 88 QT Interval:  360 QTC Calculation: 452 R Axis:   70 Text Interpretation:  Normal sinus rhythm Anterior infarct , age undetermined Abnormal ECG No old tracing to compare Confirmed by Dione Booze ([id number]) on 07/08/2018 6:26:14 AM   EKG Interpretation  Date/Time:  Saturday July 08 2018 05:11:24 EDT Ventricular Rate:  95 PR Interval:  192 QRS Duration: 88 QT Interval:  360 QTC Calculation: 452 R Axis:   70 Text Interpretation:  Normal sinus rhythm Anterior infarct , age undetermined Abnormal ECG No old tracing to compare Confirmed by Dione Booze ([id number]) on 07/08/2018 6:26:14 AM Also confirmed by Dione Booze ([id number]), editor Josephine Igo [phone])  on 07/08/2018 9:49:16 AM      ED ECG  REPORT   Date: 07/08/2018  Rate: 89  Rhythm: normal sinus rhythm  QRS Axis: normal  Intervals: normal  ST/T Wave abnormalities: nonspecific ST changes  Conduction Disutrbances:none  Narrative Interpretation:   Old EKG Reviewed: unchanged  I have personally reviewed the EKG tracing and agree with the computerized printout as noted.    Radiology Dg Chest 2 View  Result Date: 07/08/2018 CLINICAL DATA:  Chest pain radiating to the back for 1 week. Shortness of breath and diaphoresis. EXAM: CHEST - 2 VIEW COMPARISON:  10/18/2017 FINDINGS: Cardiac enlargement. Normal pulmonary vascularity. No airspace disease or consolidation. No blunting of costophrenic angles. No pneumothorax. Mediastinal contours appear intact. No change. IMPRESSION: Cardiac enlargement. No evidence of active pulmonary disease. Electronically Signed   By: Chrissie Noa  Andria Meuse M.D.   On: 07/08/2018 06:03   Ct Angio Chest Pe W And/or Wo Contrast  Result Date: 07/08/2018 CLINICAL DATA:  Shortness of breath EXAM: CT ANGIOGRAPHY CHEST WITH CONTRAST TECHNIQUE: Multidetector CT imaging of the chest was performed using the standard protocol during bolus administration of intravenous contrast. Multiplanar CT image reconstructions and MIPs were obtained to evaluate the vascular anatomy. CONTRAST:  80mL ISOVUE-370 IOPAMIDOL (ISOVUE-370) INJECTION 76% COMPARISON:  None. FINDINGS: Cardiovascular: Thoracic aorta is within normal limits. Mild cardiomegaly is noted. Pulmonary artery is well visualized within normal enhancement pattern. No filling defects to suggest pulmonary emboli are identified. Mediastinum/Nodes: Esophagus is within normal limits. No sizable hilar or mediastinal adenopathy is identified. The thoracic inlet is within normal limits. Lungs/Pleura: Mild dependent atelectatic changes are noted in the lungs bilaterally. No other focal abnormality is seen. Upper Abdomen: No acute abnormality. Musculoskeletal: Degenerative changes of the  thoracic spine noted. No acute bony abnormality is seen. Mildly prominent soft tissue changes are noted in the left breast which may represent some mild gynecomastia. Review of the MIP images confirms the above findings. IMPRESSION: No evidence of pulmonary emboli. Electronically Signed   By: Alcide Clever M.D.   On: 07/08/2018 10:12    Procedures Procedures (including critical care time)  Medications Ordered in ED Medications  morphine 4 MG/ML injection 4 mg (4 mg Intravenous Given 07/08/18 0810)  iopamidol (ISOVUE-370) 76 % injection 80 mL (80 mLs Intravenous Contrast Given 07/08/18 0840)  iopamidol (ISOVUE-370) 76 % injection 80 mL (80 mLs Intravenous Contrast Given 07/08/18 0954)     Initial Impression / Assessment and Plan / ED Course  I have reviewed the triage vital signs and the nursing notes.  Pertinent labs & imaging results that were available during my care of the patient were reviewed by me and considered in my medical decision making (see chart for details).     BP (!) 133/91   Pulse 88   Temp 98.1 F (36.7 C) (Oral)   Resp (!) 24   Ht 6' (1.829 m)   Wt (!) 156.5 kg (345 lb)   SpO2 93%   BMI 46.79 kg/m    Final Clinical Impressions(s) / ED Diagnoses   Final diagnoses:  Costochondritis    ED Discharge Orders        Ordered    ibuprofen (ADVIL,MOTRIN) 600 MG tablet  Every 6 hours PRN     07/08/18 1319     6:36 AM Patient here with pleuritic chest pain, and shortness of breath.  He is initially tachycardic with heart rate of 107.  He does have history of hypertension but no family history of premature cardiac death.  His symptom has been ongoing for nearly a week.  No evidence of hypoxia.  Patient will benefit from a d-dimer to rule out PE.  We will also obtain delta troponin as the initial EKG shows Q waves in anterior leads, and noted old EKG for comparison.  1:11 PM Negative delta troponin.  Mildly elevated d-dimer of 1.05, chest CT angiogram obtained  without evidence of PE or any acute pulmonary disease.  Normal BMP, normal WBC and normal H&H.  Repeat EKG without concerning changes.  At this time, I encourage patient to follow-up with her primary care provider.  He would benefit from outpatient follow-up which may include cardiac follow-up if indicated.  Otherwise return precautions discussed.  Suspect costochondritis causing reproducible chest wall pain.   Fayrene Helper, PA-C 07/08/18 1319    Preston Fleeting,  Onalee Hua, MD 07/08/18 2250

## 2018-07-08 NOTE — ED Triage Notes (Signed)
Pt reports CP with radiation into back X 1 week. Also reports SOB and diaphoresis. Pt states pain 8/10, sharp in nature. Hypertensive in triage.

## 2018-07-08 NOTE — ED Notes (Signed)
This patient has received 80 ml's of Isovue370 IV  contrast extravasation into his Left Antecubital during a CT angio Chest.  The exam was performed on (date) Saturday, 07/08/18 at 0900am  Site / affected area assessed by Corrin ParkerWendy Blair, Radiology PA

## 2018-07-08 NOTE — ED Notes (Signed)
Pt alert and oriented in NAD. Pt verbalized understanding of discharge instructions. 

## 2018-07-08 NOTE — ED Notes (Signed)
Called lab to add on d-dimer blood test.

## 2018-07-08 NOTE — ED Notes (Signed)
Patient transported to X-ray 

## 2018-07-08 NOTE — Progress Notes (Signed)
  Called to evaluate left arm for contrast extravasation.  Patient is not complaining of any pain.  There is some firmness just above the AC fossa. No tenderness, no erythema, mildly warm.  Good ROM in fingers, wrist, elbow. Pulses intact.  Recommend elevate right arm for 6-12 hours. Ice pack 20-60 minutes 4 times per day and as needed for pain.  Patient understands to call if conditions worsens, any change in skin at affected area, or any neurologic symptoms or decreased ROM in the hand or arm.  Kandance Yano S Deontez Klinke PA-C 07/08/2018 10:10 AM

## 2019-01-04 DIAGNOSIS — G4733 Obstructive sleep apnea (adult) (pediatric): Secondary | ICD-10-CM | POA: Diagnosis present

## 2019-07-18 IMAGING — CR DG KNEE COMPLETE 4+V*L*
4 series · 4 of 4 positions shown · non-contrast
Comparison: None.

CLINICAL DATA: Left knee pain.  No known injury.

EXAM:
LEFT KNEE - COMPLETE 4+ VIEW

[x knee ap left (1 of 4)]
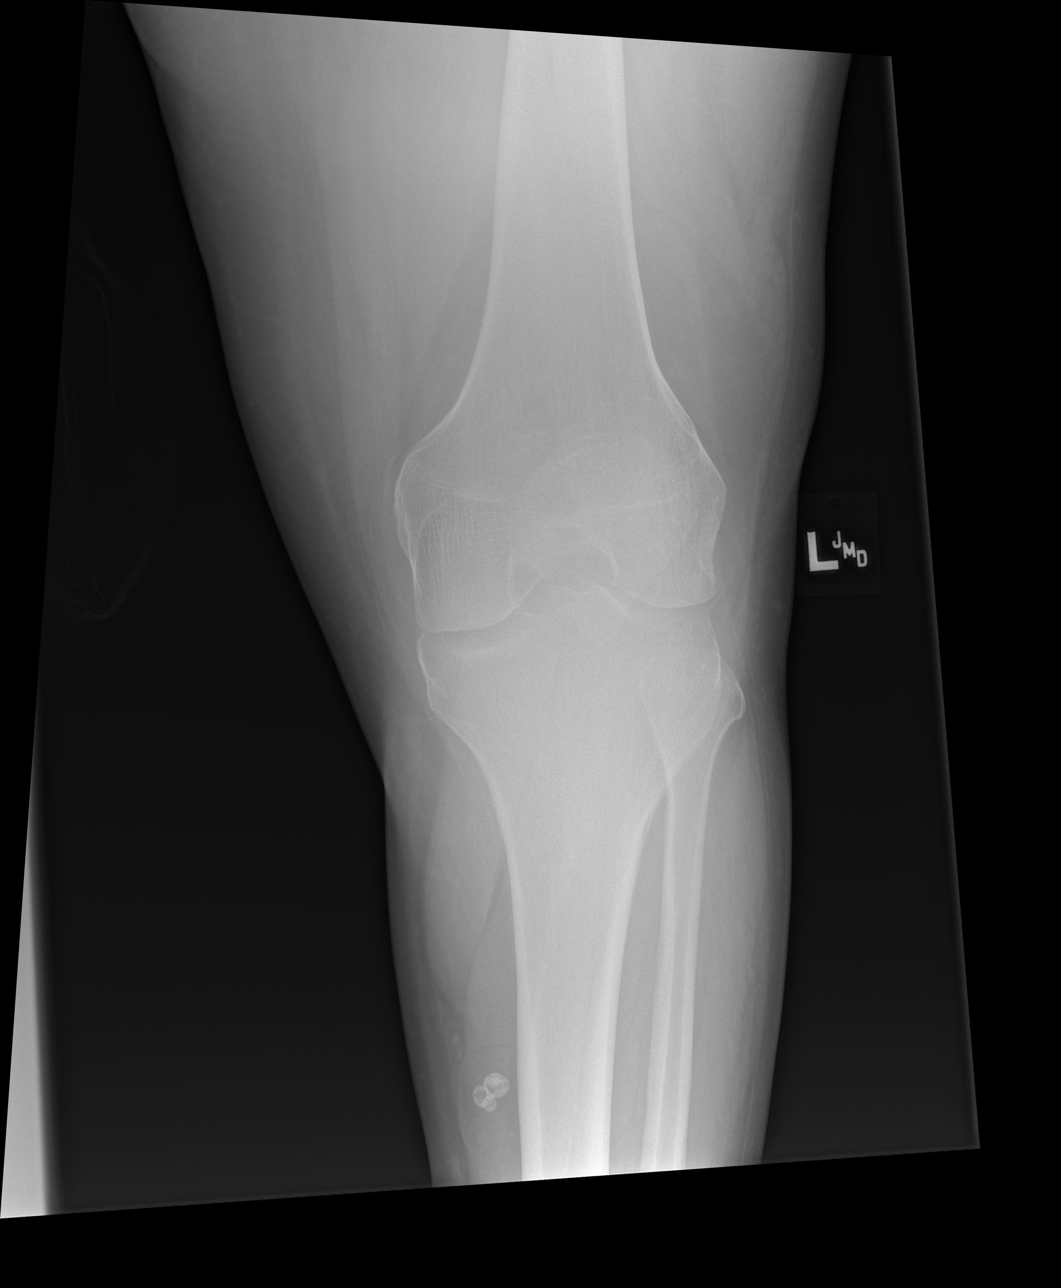

[x knee ap left (2 of 4)]
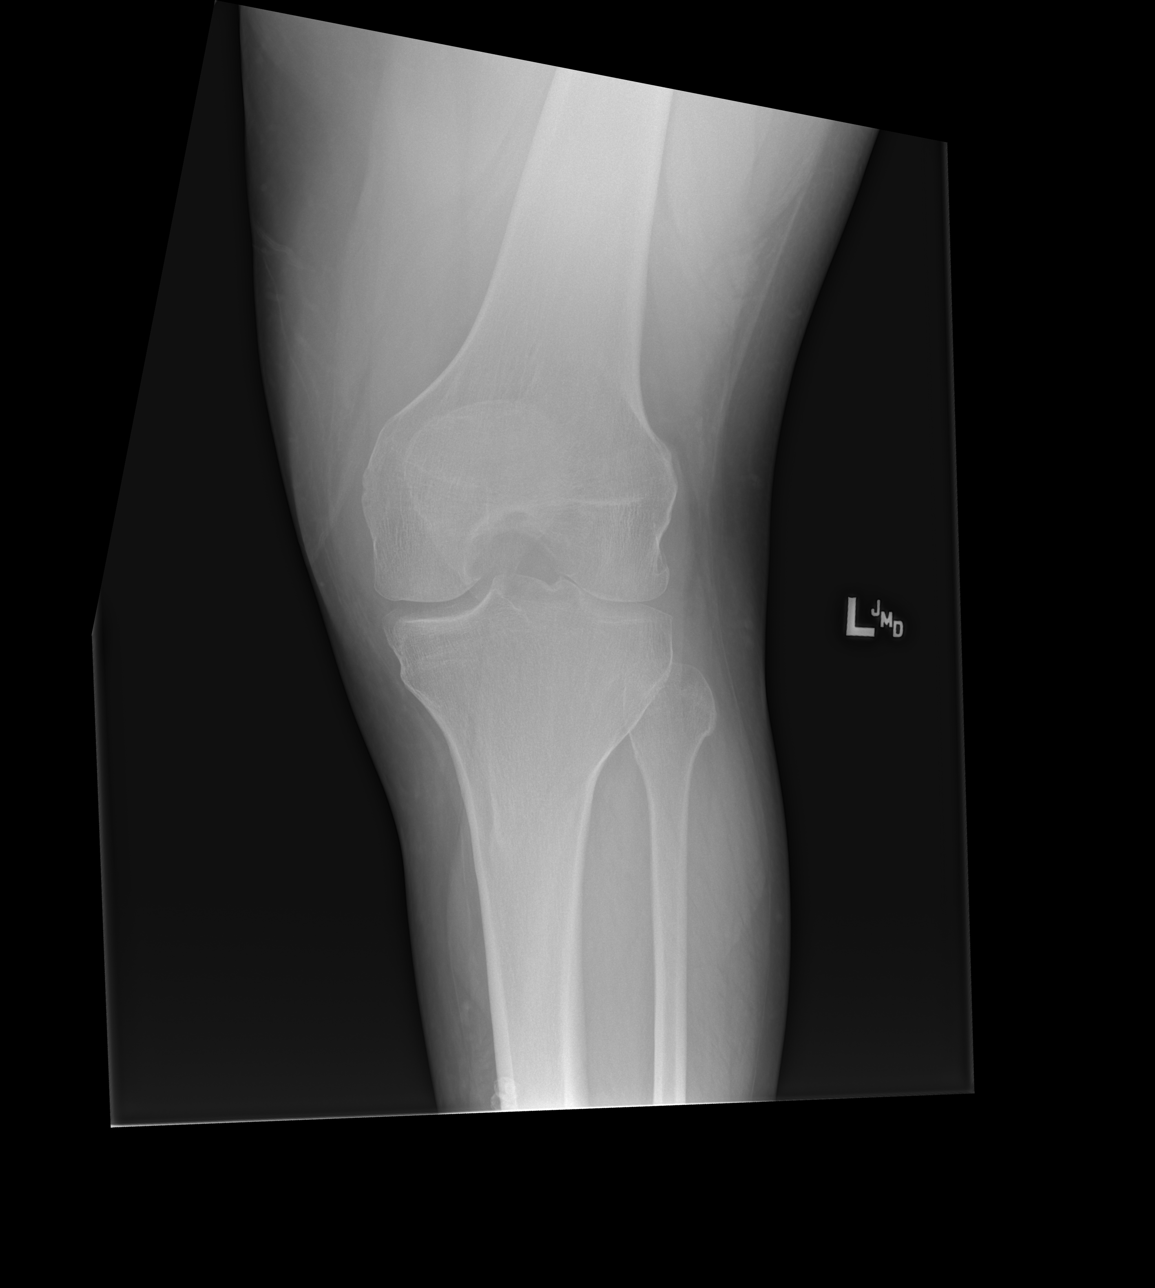

[x knee ap left (3 of 4)]
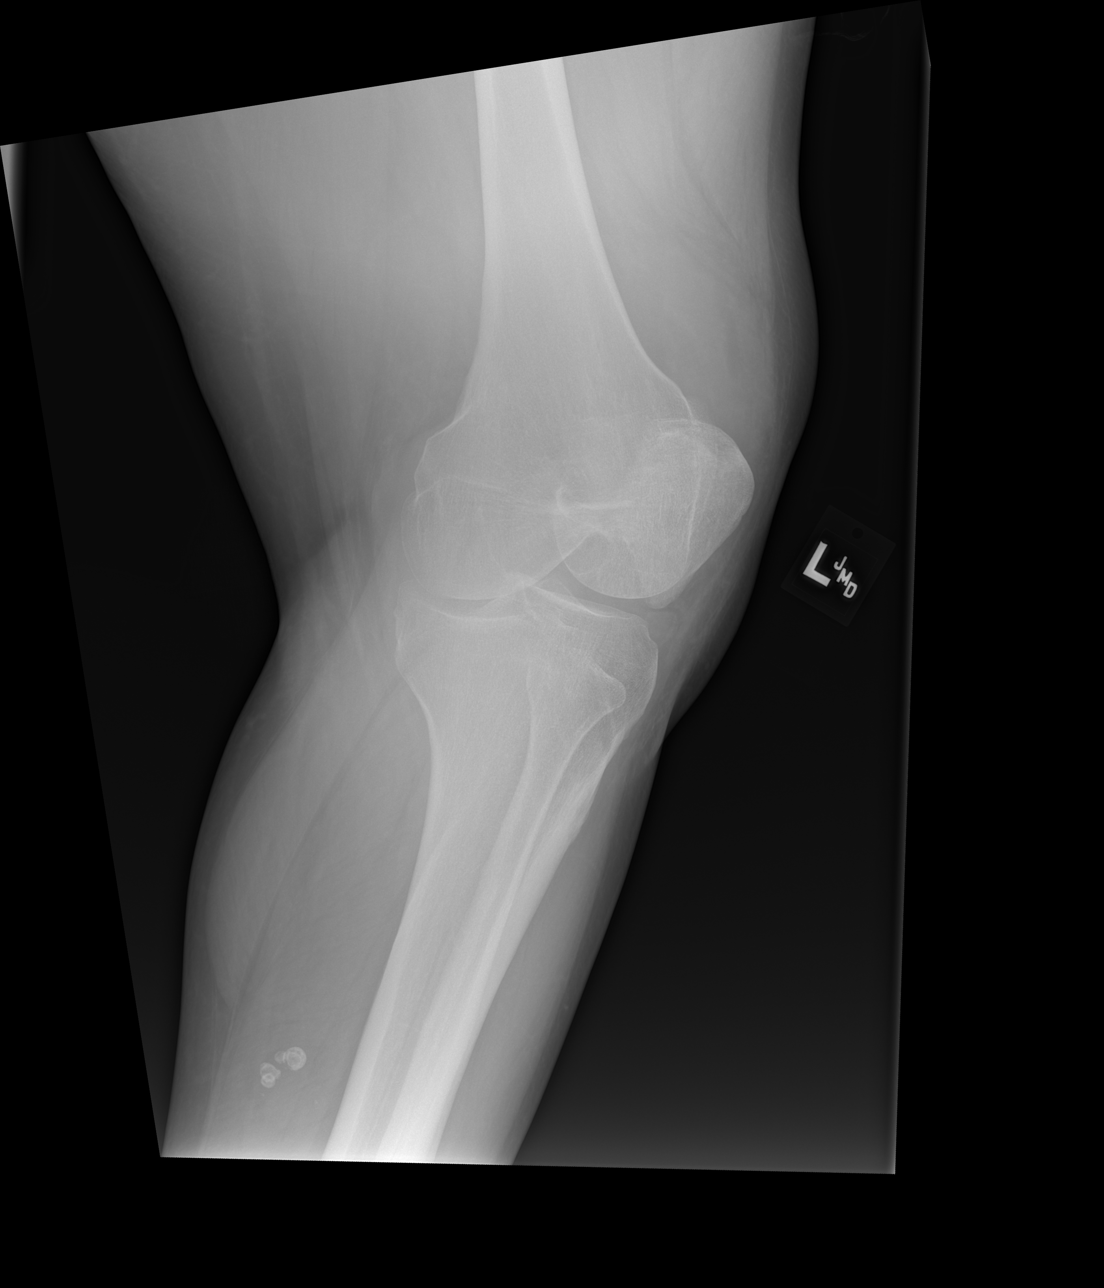

[x knee ap left (4 of 4)]
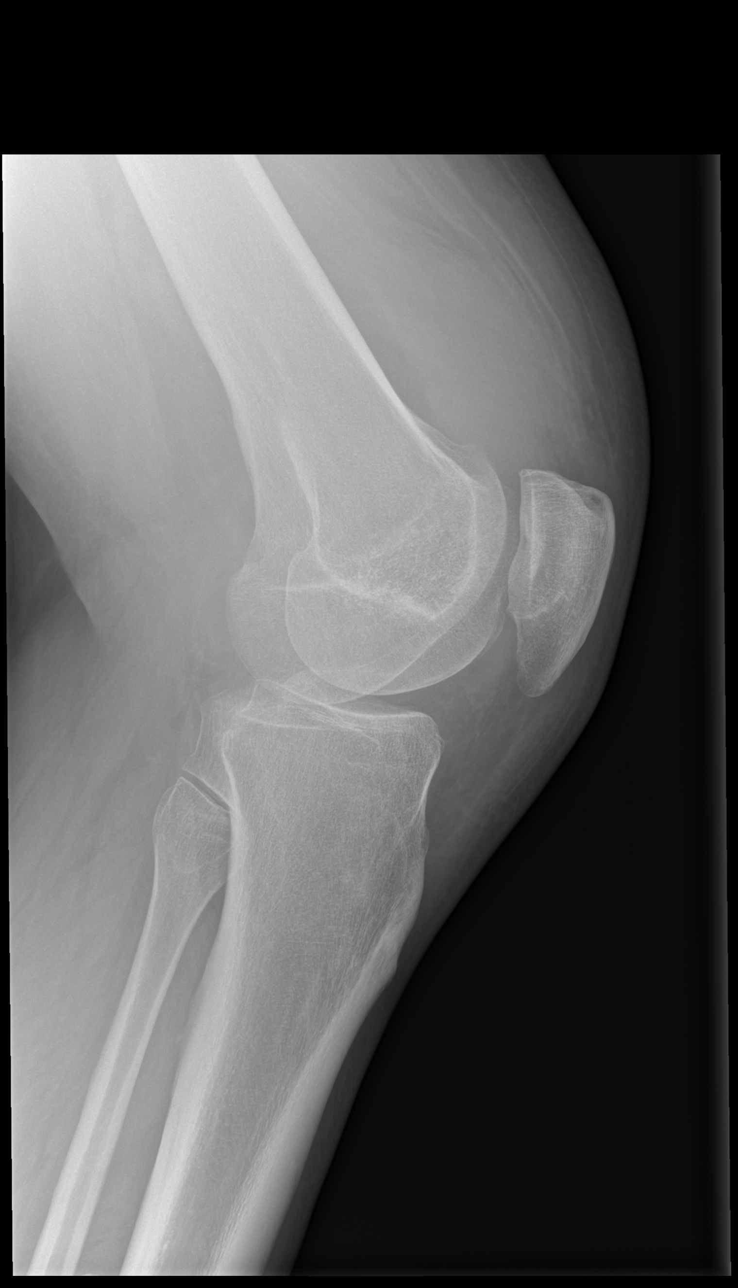

[4 of 4 positions shown; findings below may reference images not displayed]

FINDINGS: No evidence of fracture, or dislocation. There is a large
suprapatellar joint effusion. Mild 3 compartment osteoarthritic
changes.
IMPRESSION: Large suprapatellar joint effusion.

Mild 3 compartment osteoarthritic changes.

## 2019-08-02 ENCOUNTER — Other Ambulatory Visit: Payer: Self-pay

## 2019-08-02 ENCOUNTER — Encounter (HOSPITAL_COMMUNITY): Payer: Self-pay | Admitting: Emergency Medicine

## 2019-08-02 ENCOUNTER — Observation Stay (HOSPITAL_COMMUNITY)
Admission: EM | Admit: 2019-08-02 | Discharge: 2019-08-03 | Disposition: A | Discharge status: Home / Self Care | Payer: Medicaid Other | Attending: Internal Medicine | Admitting: Internal Medicine

## 2019-08-02 DIAGNOSIS — I10 Essential (primary) hypertension: Secondary | ICD-10-CM | POA: Diagnosis not present

## 2019-08-02 DIAGNOSIS — M109 Gout, unspecified: Secondary | ICD-10-CM | POA: Diagnosis not present

## 2019-08-02 DIAGNOSIS — E1165 Type 2 diabetes mellitus with hyperglycemia: Secondary | ICD-10-CM | POA: Diagnosis not present

## 2019-08-02 DIAGNOSIS — R739 Hyperglycemia, unspecified: Secondary | ICD-10-CM | POA: Diagnosis present

## 2019-08-02 DIAGNOSIS — E878 Other disorders of electrolyte and fluid balance, not elsewhere classified: Secondary | ICD-10-CM | POA: Insufficient documentation

## 2019-08-02 DIAGNOSIS — Z6841 Body Mass Index (BMI) 40.0 and over, adult: Secondary | ICD-10-CM | POA: Insufficient documentation

## 2019-08-02 DIAGNOSIS — E871 Hypo-osmolality and hyponatremia: Secondary | ICD-10-CM | POA: Diagnosis present

## 2019-08-02 DIAGNOSIS — Z79899 Other long term (current) drug therapy: Secondary | ICD-10-CM | POA: Insufficient documentation

## 2019-08-02 DIAGNOSIS — Z20828 Contact with and (suspected) exposure to other viral communicable diseases: Secondary | ICD-10-CM | POA: Diagnosis not present

## 2019-08-02 DIAGNOSIS — E119 Type 2 diabetes mellitus without complications: Secondary | ICD-10-CM

## 2019-08-02 LAB — CBG MONITORING, ED
Glucose-Capillary: >600 mg/dL (ref 70–99)
Glucose-Capillary: 589 mg/dL (ref 70–99)
Glucose-Capillary: 506 mg/dL (ref 70–99)
Glucose-Capillary: 512 mg/dL (ref 70–99)
Glucose-Capillary: 498 mg/dL — ABNORMAL HIGH (ref 70–99)

## 2019-08-02 LAB — GLUCOSE, CAPILLARY
Glucose-Capillary: 259 mg/dL — ABNORMAL HIGH (ref 70–99)
Glucose-Capillary: 272 mg/dL — ABNORMAL HIGH (ref 70–99)
Glucose-Capillary: 375 mg/dL — ABNORMAL HIGH (ref 70–99)
Glucose-Capillary: 393 mg/dL — ABNORMAL HIGH (ref 70–99)
Glucose-Capillary: 403 mg/dL — ABNORMAL HIGH (ref 70–99)
Glucose-Capillary: 413 mg/dL — ABNORMAL HIGH (ref 70–99)

## 2019-08-02 LAB — RAPID URINE DRUG SCREEN, HOSP PERFORMED
Amphetamines: NOT DETECTED
Barbiturates: NOT DETECTED
Benzodiazepines: NOT DETECTED
Cocaine: NOT DETECTED
Opiates: NOT DETECTED
Tetrahydrocannabinol: NOT DETECTED

## 2019-08-02 LAB — BASIC METABOLIC PANEL
Anion gap: 16 — ABNORMAL HIGH (ref 5–15)
Anion gap: 12 (ref 5–15)
Anion gap: 14 (ref 5–15)
Anion gap: 15 (ref 5–15)
BUN: 33 mg/dL — ABNORMAL HIGH (ref 6–20)
BUN: 25 mg/dL — ABNORMAL HIGH (ref 6–20)
BUN: 25 mg/dL — ABNORMAL HIGH (ref 6–20)
BUN: 22 mg/dL — ABNORMAL HIGH (ref 6–20)
CO2: 23 mmol/L (ref 22–32)
CO2: 17 mmol/L — ABNORMAL LOW (ref 22–32)
CO2: 21 mmol/L — ABNORMAL LOW (ref 22–32)
CO2: 21 mmol/L — ABNORMAL LOW (ref 22–32)
Calcium: 9.7 mg/dL (ref 8.9–10.3)
Calcium: 7.6 mg/dL — ABNORMAL LOW (ref 8.9–10.3)
Calcium: 9 mg/dL (ref 8.9–10.3)
Calcium: 9.2 mg/dL (ref 8.9–10.3)
Chloride: 88 mmol/L — ABNORMAL LOW (ref 98–111)
Chloride: 104 mmol/L (ref 98–111)
Chloride: 96 mmol/L — ABNORMAL LOW (ref 98–111)
Chloride: 97 mmol/L — ABNORMAL LOW (ref 98–111)
Creatinine, Ser: 1.54 mg/dL — ABNORMAL HIGH (ref 0.61–1.24)
Creatinine, Ser: 1.12 mg/dL (ref 0.61–1.24)
Creatinine, Ser: 1.38 mg/dL — ABNORMAL HIGH (ref 0.61–1.24)
Creatinine, Ser: 1.3 mg/dL — ABNORMAL HIGH (ref 0.61–1.24)
GFR calc Af Amer: >60 mL/min (ref >60)
GFR calc Af Amer: >60 mL/min (ref >60)
GFR calc Af Amer: >60 mL/min (ref >60)
GFR calc Af Amer: >60 mL/min (ref >60)
GFR calc non Af Amer: 58 mL/min — ABNORMAL LOW (ref >60)
GFR calc non Af Amer: >60 mL/min (ref >60)
GFR calc non Af Amer: >60 mL/min (ref >60)
GFR calc non Af Amer: >60 mL/min (ref >60)
Glucose, Bld: 675 mg/dL (ref 70–99)
Glucose, Bld: 424 mg/dL — ABNORMAL HIGH (ref 70–99)
Glucose, Bld: 438 mg/dL — ABNORMAL HIGH (ref 70–99)
Glucose, Bld: 424 mg/dL — ABNORMAL HIGH (ref 70–99)
Potassium: 5.4 mmol/L — ABNORMAL HIGH (ref 3.5–5.1)
Potassium: 4 mmol/L (ref 3.5–5.1)
Potassium: 4.8 mmol/L (ref 3.5–5.1)
Potassium: 4.6 mmol/L (ref 3.5–5.1)
Sodium: 127 mmol/L — ABNORMAL LOW (ref 135–145)
Sodium: 133 mmol/L — ABNORMAL LOW (ref 135–145)
Sodium: 131 mmol/L — ABNORMAL LOW (ref 135–145)
Sodium: 133 mmol/L — ABNORMAL LOW (ref 135–145)

## 2019-08-02 LAB — CBC
HCT: 42.6 % (ref 39.0–52.0)
Hemoglobin: 14.6 g/dL (ref 13.0–17.0)
MCH: 26.6 pg (ref 26.0–34.0)
MCHC: 34.3 g/dL (ref 30.0–36.0)
MCV: 77.7 fL — ABNORMAL LOW (ref 80.0–100.0)
Platelets: 244 10*3/uL (ref 150–400)
RBC: 5.48 MIL/uL (ref 4.22–5.81)
RDW: 12.8 % (ref 11.5–15.5)
WBC: 5.5 10*3/uL (ref 4.0–10.5)
nRBC: 0 % (ref 0.0–0.2)

## 2019-08-02 LAB — URINALYSIS, ROUTINE W REFLEX MICROSCOPIC
Bacteria, UA: NONE SEEN
Bilirubin Urine: NEGATIVE
Glucose, UA: >=500 mg/dL — AB
Hgb urine dipstick: NEGATIVE
Ketones, ur: 5 mg/dL — AB
Leukocytes,Ua: NEGATIVE
Nitrite: NEGATIVE
Protein, ur: NEGATIVE mg/dL
Specific Gravity, Urine: 1.021 (ref 1.005–1.030)
pH: 5 (ref 5.0–8.0)

## 2019-08-02 LAB — HEMOGLOBIN A1C
Hgb A1c MFr Bld: 12.9 % — ABNORMAL HIGH (ref 4.8–5.6)
Mean Plasma Glucose: 323.53 mg/dL

## 2019-08-02 MED ORDER — AMLODIPINE BESYLATE 5 MG PO TABS
5.0000 mg | ORAL_TABLET | Freq: Every day | ORAL | Status: DC
  Administered 2019-08-02 – 2019-08-03 (×2): 5 mg via ORAL
  Filled 2019-08-02 (×2): qty 1

## 2019-08-02 MED ORDER — SODIUM CHLORIDE 0.9 % IV BOLUS
1000.0000 mL | Freq: Once | INTRAVENOUS | Status: AC
  Administered 2019-08-02: 1000 mL via INTRAVENOUS

## 2019-08-02 MED ORDER — INSULIN REGULAR(HUMAN) IN NACL 100-0.9 UT/100ML-% IV SOLN
INTRAVENOUS | Status: DC
  Administered 2019-08-02: 4.4 [IU]/h via INTRAVENOUS
  Filled 2019-08-02 (×2): qty 100

## 2019-08-02 MED ORDER — ENOXAPARIN SODIUM 40 MG/0.4ML ~~LOC~~ SOLN
40.0000 mg | SUBCUTANEOUS | Status: DC
  Administered 2019-08-02: 40 mg via SUBCUTANEOUS
  Filled 2019-08-02: qty 0.4

## 2019-08-02 MED ORDER — DEXTROSE-NACL 5-0.45 % IV SOLN: INTRAVENOUS | Status: DC

## 2019-08-02 MED ORDER — DEXTROSE 50 % IV SOLN: 25.0000 mL | INTRAVENOUS | Status: DC | PRN

## 2019-08-02 MED ORDER — SODIUM CHLORIDE 0.9 % IV SOLN
INTRAVENOUS | Status: DC
  Administered 2019-08-02: 19:00:00 via INTRAVENOUS

## 2019-08-02 MED ORDER — POTASSIUM CHLORIDE 10 MEQ/100ML IV SOLN: 10.0000 meq | INTRAVENOUS | Status: AC

## 2019-08-02 MED ORDER — INSULIN REGULAR BOLUS VIA INFUSION: 0.0000 [IU] | Freq: Three times a day (TID) | INTRAVENOUS | Status: DC

## 2019-08-02 MED ORDER — INSULIN REGULAR(HUMAN) IN NACL 100-0.9 UT/100ML-% IV SOLN: INTRAVENOUS | Status: DC

## 2019-08-02 MED ORDER — SODIUM CHLORIDE 0.9 % IV SOLN: INTRAVENOUS | Status: AC

## 2019-08-02 MED ORDER — LIVING WELL WITH DIABETES BOOK
Freq: Once | Status: AC
  Administered 2019-08-02: 22:00:00
  Filled 2019-08-02 (×2): qty 1

## 2019-08-02 MED ORDER — INSULIN ASPART 100 UNIT/ML IV SOLN
10.0000 [IU] | Freq: Once | INTRAVENOUS | Status: AC
  Administered 2019-08-02: 10 [IU] via INTRAVENOUS

## 2019-08-02 MED ORDER — ALLOPURINOL 300 MG PO TABS
300.0000 mg | ORAL_TABLET | Freq: Every day | ORAL | Status: DC
  Administered 2019-08-03: 300 mg via ORAL
  Filled 2019-08-02: qty 1

## 2019-08-02 MED ORDER — SODIUM CHLORIDE 0.9 % IV SOLN: INTRAVENOUS | Status: DC

## 2019-08-02 MED ORDER — INSULIN STARTER KIT- PEN NEEDLES (ENGLISH)
1.0000 | Freq: Once | Status: AC
  Administered 2019-08-02: 1
  Filled 2019-08-02: qty 1

## 2019-08-02 MED ORDER — DEXTROSE-NACL 5-0.45 % IV SOLN
INTRAVENOUS | Status: DC
  Administered 2019-08-03: 01:00:00 via INTRAVENOUS

## 2019-08-02 NOTE — H&P (Signed)
Entry error - note dated earlier today was actually H&P rather than consult note.

## 2019-08-02 NOTE — Progress Notes (Signed)
Patient arrives to room 5W18 at this time

## 2019-08-02 NOTE — ED Triage Notes (Addendum)
Patient sent by doctor for blood sugar over 600 - saw PCP yesterday for fatigue, increased urination, and extreme thirst. NAD noted, resp e/u, skin w/d. No prior history of diabetes.

## 2019-08-02 NOTE — H&P (Addendum)
History and Physical   Christopher KaufmannDaris Hays JYN:[id number] DOB: 12-May-1986 DOA: 08/02/2019  PCP:   Emogene MorganPayne, [facility] Consultants:  None Patient coming from:  Home - lives with Wife and 3 kids; NOK: Wife, [phone]  Chief Complaint: Hyperglycemia  HPI: Christopher Hays is a 33 y.o. male with medical history significant of HTN presenting with hyperglycemia.   He has had "crazy fatigue, urination was like every 20 minutes", numbness in his hands, polyuria, blurred vision.  His symptoms started a week ago Monday and he finally went to his PCP yesterday.  He had blood work and they called him about 3AM and finally got in touch with them about 6AM and told him to come to the ER.  No symptoms prior to last Monday.  He has lost maybe 20 pounds in 2 weeks.  He was having diarrhea.     ED Course:  New DM - I have recommended 10 units of IV insulin, 2L IVF, and DM coordinator consult here in ER and I will.  Review of Systems: As per HPI; otherwise review of systems reviewed and negative.   Ambulatory Status:  Ambulates without assistance  Past Medical History:  Diagnosis Date  . Gout   . Hypertension     History reviewed. No pertinent surgical history.  Social History   Socioeconomic History  . Marital status: Married    Spouse name: Not on file  . Number of children: Not on file  . Years of education: Not on file  . Highest education level: Not on file  Occupational History  . Occupation: unemployed due to [employer]  Social Needs  . Financial resource strain: Not on file  . Food insecurity    Worry: Not on file    Inability: Not on file  . Transportation needs    Medical: Not on file    Non-medical: Not on file  Tobacco Use  . Smoking status: Never Smoker  . Smokeless tobacco: Never Used  Substance and Sexual Activity  . Alcohol use: Yes    Comment: occasional   . Drug use: No  . Sexual activity: Not on file  Lifestyle  . Physical activity    Days per week: Not on file    Minutes per  session: Not on file  . Stress: Not on file  Relationships  . Social Musicianconnections    Talks on phone: Not on file    Gets together: Not on file    Attends religious service: Not on file    Active member of club or organization: Not on file    Attends meetings of clubs or organizations: Not on file    Relationship status: Not on file  . Intimate partner violence    Fear of current or ex partner: Not on file    Emotionally abused: Not on file    Physically abused: Not on file    Forced sexual activity: Not on file  Other Topics Concern  . Not on file  Social History Narrative  . Not on file    No Known Allergies  Family History  Problem Relation Age of Onset  . Gout Paternal Grandmother   . Gout Paternal Grandfather   . Diabetes Neg Hx     Prior to Admission medications   Medication Sig Start Date End Date Taking? Authorizing Provider  allopurinol (ZYLOPRIM) 300 MG tablet Take 300 mg by mouth daily. 06/29/18   [provider]  amLODipine (NORVASC) 5 MG tablet Take 5 mg daily by mouth.  [provider]  ibuprofen (ADVIL,MOTRIN) 600 MG tablet Take 1 tablet (600 mg total) by mouth every 6 (six) hours as needed. 07/08/18   Domenic Moras, PA-C    Physical Exam: Vitals:   08/02/19 0806 08/02/19 1035 08/02/19 1100  BP: (!) 176/92 (!) 143/100 (!) 147/96  Pulse: (!) 105 95 96  Resp: 18 18 18   Temp: 98.5 F (36.9 C)    TempSrc: Oral    SpO2: 97% 95% 97%     . General:  Appears calm and comfortable and is NAD . Eyes:  PERRL, EOMI, normal lids, iris . ENT:  grossly normal hearing, lips & tongue, mildly dry mm; appropriate dentition . Neck:  no LAD, masses or thyromegaly . Cardiovascular:  RRR, no m/r/g. No LE edema.  Marland Kitchen Respiratory:   CTA bilaterally with no wheezes/rales/rhonchi.  Normal respiratory effort. . Abdomen:  soft, NT, ND, NABS . Back:   normal alignment, no CVAT . Skin:  no rash or induration seen on limited exam . Musculoskeletal:  grossly normal  tone BUE/BLE, good ROM, no bony abnormality . Psychiatric:  grossly normal mood and affect, speech fluent and appropriate, AOx3 . Neurologic:  CN 2-12 grossly intact, moves all extremities in coordinated fashion    Radiological Exams on Admission: No results found.  EKG: Not done   Labs on Admission: I have personally reviewed the available labs and imaging studies at the time of the admission.  Pertinent labs:   Na++ 127 K+ 5.4 CO2 23 Glucose 675, 589 BUN 33/Creatinine 1.54/GFR >60 CBC WNL A1c 12.9 UA: >500 glucose, 5 ketones  Assessment/Plan Principal Problem:   Diabetes mellitus with hyperglycemia, without long-term current use of insulin (HCC) Active Problems:   Gout   Essential hypertension   Obesity, Class III, BMI 40-49.9 (morbid obesity) (HCC)    New-onset DM -Patient with several weeks of escalating symptoms concerning for DM -Found to have marked hyperglycemia on lab testing performed at PCP office yesterday - but his glucose was 206 on unrelated visit to ER on 7/27 (diagnostic of DM) and 148 in 2018 -Strongly suspect type 2 DM, but he remains hyperglycemic and dehydrated -Initially attempted ER management with goal for d/c and outpatient PCP f/u but CO2 decreased despite hydration and IV insulin so will observe overnight and continue to follow -No indication of illness as source -A1c indicates poor long-term control -Will observe in SDU with Glucose stabilizer protocol -K+ 4.0 and so potassium supplementation added -IVF at 150 cc/hr, NS until glucose <250 and then decrease rate to 125 and change to D51/2NS -Diabetes coordinator consulted -Anticipate d/c to home tomorrow -CM has consulted, patient has access to PCP f/u and CM will ensure patient has access to medications  HTN -Continue Norvasc -Suboptimal control in the ER, may need dosage adjustment  Gout -Continue Allopurinol  Obesity -BMI 47 -Suggest outpatient bariatric medicine and/or surgery  referral      Note: This patient has had testing ordered and is pending for the novel coronavirus COVID-19.  DVT prophylaxis:  Lovenox  Code Status:  Full  Family Communication: None present  Disposition Plan:  Home once clinically improved Consults called: Diabetes coordinator; CM  Admission status: It is my clinical opinion that referral for OBSERVATION is reasonable and necessary in this patient based on the above information provided. The aforementioned taken together are felt to place the patient at high risk for further clinical deterioration. However it is anticipated that the patient may be medically stable for discharge from  the hospital within 24 to 48 hours.     Jonah BlueJennifer Stanlee Roehrig MD [facility]   How to contact the [facility] Attending or Consulting provider 7A - 7P or covering provider during after hours 7P -7A, for this patient?  1. Check the care team in [facility] and look for a) attending/consulting [facility] provider listed and b) the [facility] team listed 2. Log into [email or web address] and use [place]'s universal password to access. If you do not have the password, please contact the hospital operator. 3. Locate the [facility] provider you are looking for under [facility] and page to a number that you can be directly reached. 4. If you still have difficulty reaching the provider, please page the [facility] (Director on Call) for the Hospitalists listed on amion for assistance.   08/02/2019, 4:02 PM

## 2019-08-02 NOTE — Progress Notes (Signed)
Inpatient Diabetes Program Recommendations  AACE/ADA: New Consensus Statement on Inpatient Glycemic Control (2015)  Target Ranges:  Prepandial:   less than 140 mg/dL      Peak postprandial:   less than 180 mg/dL (1-2 hours)      Critically ill patients:  140 - 180 mg/dL   Lab Results  Component Value Date   GLUCAP 512 ([place]) 08/02/2019   HGBA1C 12.9 (H) 08/02/2019    Review of Glycemic Control  Diabetes history: None New Diagnosis  Current orders for Inpatient glycemic control: IV insulin  Inpatient Diabetes Program Recommendations:    Spoke with patient about new diabetes diagnosis.  Discussed A1C results (12.9% this admission) and explained what an A1C is and informed patient that his current A1C indicates an average glucose slightly over 300 mg/dl over the past 2-3 months. Discussed basic pathophysiology of DM Type 2, basic home care, importance of checking CBGs and maintaining good CBG control to prevent long-term and short-term complications. Reviewed glucose and A1C goals.    Reviewed signs and symptoms of hyperglycemia and hypoglycemia along with treatment for both. Discussed impact of nutrition, exercise, stress, sickness, and medications on diabetes control.   Discussed in detail regarding carbohydrates, carbohydrate goals per day and meal, along with portion sizes.   Example of breakfast  Cereal in large bowl or Sausage eggs 2-3 pancakes or  2 flavored oatmeal packets or Stove top grits  Example Lunch 1 sandwich on white bread or 8-10 chicken nuggets hand full of fries (air fryer) 1 fruit cup or 2 hot dogs or 1-2 hamburgers  Example supper Chicken, pork chops (baked, airfryer) Spaghetti ground beef Sloppy joe Veggies broccoli green beans, corn, carrots  (Patient has 3 kids at home 39yr, 8yr, 4 month)  Discussed healthier options and sugar free beverages  Reviewed Living Well with diabetes booklet and encouraged patient to read through entire book. Informed  patient that he will be prescribed Novolin 70/30 since it is more affordable. Informed patient that Novolin 70/30 can be purchased at [facility] for $25 per vial or $43 insulin pen.   Provided patient with handout information on Reli-On products and encouraged patient to go to [facility] to get the Reli-On Glucose meter supplies.  Discussed 70/30 insulin in detail (how to take it, when to take it) and instructed patient he would begin taking it today with supper. Asked patient to check his glucose at least 4 times per day (before meals and at bedtime) and to keep a log book of glucose readings and insulin taken. Explained how the doctor he follows up with can use the log book to continue to make insulin adjustments if needed.   Reviewed and demonstrated how to draw up and administer insulin with vial and syringe and insulin pen. Patient was able to successfully demonstrate how to draw up and administer insulin with vial and syringe and insulin pen.   Informed patient that RN will be asking him to self-administer insulin to ensure proper technique and ability to administer self insulin shots.   Patient verbalized understanding of information discussed and he states that he has no further questions at this time related to diabetes.   RNs to provide ongoing basic DM education at bedside with this patient and engage patient to actively check blood glucose and administer insulin injections.   At time of d/c patient will need  Novolin ReliOn 70/30 Flexpen Insulin Pen (Order # [id number])  Insulin pen needles over the counter Alcohol swabs over the counter Glucose  tablets over the counter  Was given a ReliOn meter from DM Coordinator with supplies at bedside in ED  Noted patient has a PCP Romie Jumper(Morgan Payne, [facility] on the [address]) patient wants to follow up with her instead of one of our clinics. [facility] supplies and insulin and follow up with his  PCP.  At time of transition consider 70/30 25 units bid (equivalent to 35 units basal insulin and 15 units of short acting for meals).  Thanks, Christena DeemShannon Nishawn Rotan RN, MSN, BC-ADM Inpatient Diabetes Coordinator Team Pager [phone] (8a-5p)

## 2019-08-02 NOTE — ED Provider Notes (Signed)
[facility] EM[facility]GENCY [facility] Provider Note   CSN: [id number] Arrival date & time: 08/02/19  [id number]     History   Chief Complaint Chief Complaint  Patient presents with  . Hyperglycemia    HPI Christopher Hays is a 33 y.o. male.     Patient with history of high blood pressure and gout presents with elevated glucose.  Patient saw primary doctor and sent in for further work-up.  Patient said increased thirst increased urination fatigue and sugars over 600.  No history of known diabetes.  No family history of diabetes.  Patient denies any fever or infectious symptoms.     Past Medical History:  Diagnosis Date  . Gout   . Hypertension     Patient Active Problem List   Diagnosis Date Noted  . Gout 06/06/2013    History reviewed. No pertinent surgical history.      Home Medications    Prior to Admission medications   Medication Sig Start Date End Date Taking? Authorizing Provider  allopurinol (ZYLOPRIM) 300 MG tablet Take 300 mg by mouth daily. 06/29/18   [provider]  amLODipine (NORVASC) 5 MG tablet Take 5 mg daily by mouth.    [provider]  ibuprofen (ADVIL,MOTRIN) 600 MG tablet Take 1 tablet (600 mg total) by mouth every 6 (six) hours as needed. 07/08/18   Fayrene Helperran, [place], PA-C    Family History Family History  Problem Relation Age of Onset  . Gout Paternal Grandmother   . Gout Paternal Grandfather   . Diabetes Neg Hx     Social History Social History   Tobacco Use  . Smoking status: Never Smoker  . Smokeless tobacco: Never Used  Substance Use Topics  . Alcohol use: Yes    Comment: occasional   . Drug use: No     Allergies   Patient has no known allergies.   Review of Systems Review of Systems  Constitutional: Positive for fatigue. Negative for chills and fever.  HENT: Negative for congestion.   Eyes: Negative for visual disturbance.  Respiratory: Negative for shortness of breath.   Cardiovascular: Negative  for chest pain.  Gastrointestinal: Negative for abdominal pain and vomiting.  Endocrine: Positive for polydipsia, polyphagia and polyuria.  Genitourinary: Negative for dysuria and flank pain.  Musculoskeletal: Negative for back pain, neck pain and neck stiffness.  Skin: Negative for rash.  Neurological: Negative for light-headedness and headaches.     Physical Exam Updated Vital Signs BP (!) 147/96 (BP Location: Right Arm)   Pulse 96   Temp 98.5 F (36.9 C) (Oral)   Resp 18   SpO2 97%   Physical Exam Vitals signs and nursing note reviewed.  Constitutional:      Appearance: He is well-developed.  HENT:     Head: Normocephalic and atraumatic.     Mouth/Throat:     Mouth: Mucous membranes are dry.  Eyes:     General:        Right eye: No discharge.        Left eye: No discharge.     Conjunctiva/sclera: Conjunctivae normal.  Neck:     Musculoskeletal: Normal range of motion and neck supple.     Trachea: No tracheal deviation.  Cardiovascular:     Rate and Rhythm: Normal rate and regular rhythm.  Pulmonary:     Effort: Pulmonary effort is normal.     Breath sounds: Normal breath sounds.  Abdominal:     General: There is no distension.  Palpations: Abdomen is soft.     Tenderness: There is no abdominal tenderness. There is no guarding.  Skin:    General: Skin is warm.     Findings: No rash.  Neurological:     General: No focal deficit present.     Mental Status: He is alert and oriented to person, place, and time.     Cranial Nerves: No cranial nerve deficit.      ED Treatments / Results  Labs (all labs ordered are listed, but only abnormal results are displayed) Labs Reviewed  BASIC METABOLIC PANEL - Abnormal; Notable for the following components:      Result Value   Sodium 127 (*)    Potassium 5.4 (*)    Chloride 88 (*)    Glucose, Bld 675 (*)    BUN 33 (*)    Creatinine, Ser 1.54 (*)    GFR calc non Af Amer 58 (*)    Anion gap 16 (*)    All other  components within normal limits  CBC - Abnormal; Notable for the following components:   MCV 77.7 (*)    All other components within normal limits  URINALYSIS, ROUTINE W REFLEX MICROSCOPIC - Abnormal; Notable for the following components:   Color, Urine STRAW (*)    Glucose, UA >=500 (*)    Ketones, ur 5 (*)    All other components within normal limits  HEMOGLOBIN A1C - Abnormal; Notable for the following components:   Hgb A1c MFr Bld 12.9 (*)    All other components within normal limits  BASIC METABOLIC PANEL - Abnormal; Notable for the following components:   Sodium 133 (*)    CO2 17 (*)    Glucose, Bld 424 (*)    BUN 25 (*)    Calcium 7.6 (*)    All other components within normal limits  CBG MONITORING, ED - Abnormal; Notable for the following components:   Glucose-Capillary >600 (*)    All other components within normal limits  CBG MONITORING, ED - Abnormal; Notable for the following components:   Glucose-Capillary 589 (*)    All other components within normal limits  CBG MONITORING, ED - Abnormal; Notable for the following components:   Glucose-Capillary 506 (*)    All other components within normal limits  CBG MONITORING, ED - Abnormal; Notable for the following components:   Glucose-Capillary 512 (*)    All other components within normal limits  BLOOD GAS, VENOUS    EKG None  Radiology No results found.  Procedures .Critical Care Performed by: Elnora Morrison, MD Authorized by: Elnora Morrison, MD   Critical care provider statement:    Critical care time (minutes):  35   Critical care start time:  08/02/2019 11:00 AM   Critical care end time:  08/02/2019 11:35 AM   Critical care time was exclusive of:  Separately billable procedures and treating other patients and teaching time   Critical care was time spent personally by me on the following activities:  Evaluation of patient's response to treatment, examination of patient, ordering and performing treatments and  interventions, ordering and review of laboratory studies, ordering and review of radiographic studies, pulse oximetry, re-evaluation of patient's condition, obtaining history from patient or surrogate and review of old charts   (including critical care time)  Medications Ordered in ED Medications  dextrose 50 % solution 25 mL (has no administration in time range)  0.9 %  sodium chloride infusion ( Intravenous Not Given 08/02/19 1205)  living well with diabetes book MISC (has no administration in time range)  sodium chloride 0.9 % bolus 1,000 mL (1,000 mLs Intravenous New Bag/Given 08/02/19 1047)  insulin aspart (novoLOG) injection 10 Units (10 Units Intravenous Given 08/02/19 1212)  sodium chloride 0.9 % bolus 1,000 mL (1,000 mLs Intravenous New Bag/Given 08/02/19 1212)  insulin aspart (novoLOG) injection 10 Units (10 Units Intravenous Given 08/02/19 1427)     Initial Impression / Assessment and Plan / ED Course  I have reviewed the triage vital signs and the nursing notes.  Pertinent labs & imaging results that were available during my care of the patient were reviewed by me and considered in my medical decision making (see chart for details).       Patient with obesity and high blood pressure history presents with new onset diabetes.  Sugars greater than 600.  Patient dry and has clinical symptoms consistent with diabetes.  IV fluids ordered.  Insulin glucose stabilizer/drip ordered to improve levels.  Recommended observation to improve sugar levels, diabetic education and then patient will need oral medication close outpatient follow-up.  Discussed diabetes and importance of management with the patient including lifestyle changes and dietary changes in detail.  Sodium 127 secondary to elevated glucose greater than 600.  Potassium 5.4.  A1c greater than 12.  Minimal ketones in the urine and no anion gap.  Patient received 2 L IV fluids and 2 rounds of IV insulin however sugar still 400 and  mild decrease in bicarb.  Initial goal to have patient follow-up outpatient, diabetic educator/case management evaluate and assisted to ensure he can get medications outpatient.  Plan for observation in the hospital for further control his sugars and education.    Final Clinical Impressions(s) / ED Diagnoses   Final diagnoses:  Diabetes mellitus, new onset (HCC)  Hyperglycemia  Hyponatremia  Hypochloremia    ED Discharge Orders    None       Blane OharaZavitz, Jannatul Wojdyla, MD 08/02/19 1555

## 2019-08-02 NOTE — ED Notes (Signed)
CBG Results of 498 reported to [place], Therapist, sports.

## 2019-08-02 NOTE — ED Notes (Signed)
ED TO INPATIENT HANDOFF REPORT  ED Nurse Name and Phone #: Alycia RossettiRyan, [phone]  S Name/Age/Gender Christopher Hays 33 y.o. male Room/Bed: 039C/039C  Code Status   Code Status: Full Code  Home/SNF/Other Home Patient oriented to: self, place, time and situation Is this baseline? Yes   Triage Complete: Triage complete  Chief Complaint HYPERGLYCEMIA OVER 600  Triage Note Patient sent by doctor for blood sugar over 600 - saw PCP yesterday for fatigue, increased urination, and extreme thirst. NAD noted, resp e/u, skin w/d. No prior history of diabetes.   Allergies No Known Allergies  Level of Care/Admitting Diagnosis ED Disposition    ED Disposition Condition Comment   Admit  [facility] [[id number]]  Level of Care: Progressive [102]  I expect the patient will be discharged within 24 hours: Yes  LOW acuity---Tx typically complete <24 hrs---ACUTE conditions typically can be evaluated <24 hours---LABS likely to return to acceptable levels <24 hours---IS near functional baseline---EXPECTED to return to current living arrangement---NOT newly hypoxic: Meets criteria for 5C-Observation unit  Covid Evaluation: Asymptomatic Screening Protocol (No Symptoms)  Diagnosis: Hyperglycemia [454098][292486]  Admitting Physician: Jonah BlueYATES, JENNIFER [[id number]]  Attending Physician: Jonah BlueYATES, JENNIFER [[id number]]  PT Class (Do Not Modify): Observation [104]  PT Acc Code (Do Not Modify): Observation [10022]       B Medical/Surgery History Past Medical History:  Diagnosis Date  . Gout   . Hypertension    History reviewed. No pertinent surgical history.   A IV Location/Drains/Wounds Patient Lines/Drains/Airways Status   Active Line/Drains/Airways    Name:   Placement date:   Placement time:   Site:   Days:   Peripheral IV 08/02/19 Left Antecubital   08/02/19    1035    Antecubital   less than 1          Intake/Output Last 24 hours No intake or output data in the 24 hours ending  08/02/19 1653  Labs/Imaging Results for orders placed or performed during the hospital encounter of 08/02/19 (from the past 48 hour(s))  Basic metabolic panel     Status: Abnormal   Collection Time: 08/02/19  8:11 AM  Result Value Ref Range   Sodium 127 (L) 135 - 145 mmol/L   Potassium 5.4 (H) 3.5 - 5.1 mmol/L   Chloride 88 (L) 98 - 111 mmol/L   CO2 23 22 - 32 mmol/L   Glucose, Bld 675 (HH) 70 - 99 mg/dL    Comment: CRITICAL RESULT CALLED TO, READ BACK BY AND VERIFIED WITHLevin Erp: B HANNAH RN [phone] BY A BENNETT    BUN 33 (H) 6 - 20 mg/dL   Creatinine, Ser 1.301.54 (H) 0.61 - 1.24 mg/dL   Calcium 9.7 8.9 - 86.510.3 mg/dL   GFR calc non Af Amer 58 (L) >60 mL/min   GFR calc Af Amer >60 >60 mL/min   Anion gap 16 (H) 5 - 15    Comment: Performed at [facility], [address] [place], [place] [address]  CBC     Status: Abnormal   Collection Time: 08/02/19  8:11 AM  Result Value Ref Range   WBC 5.5 4.0 - 10.5 K/uL   RBC 5.48 4.22 - 5.81 MIL/uL   Hemoglobin 14.6 13.0 - 17.0 g/dL   HCT 62.942.6 52.839.0 - 41.352.0 %   MCV 77.7 (L) 80.0 - 100.0 fL   MCH 26.6 26.0 - 34.0 pg   MCHC 34.3 30.0 - 36.0 g/dL   RDW 24.412.8 01.011.5 - 27.215.5 %  Platelets 244 150 - 400 K/uL   nRBC 0.0 0.0 - 0.2 %    Comment: Performed at [facility], [place] [address], [place], [place] [address]  Urinalysis, Routine w reflex microscopic     Status: Abnormal   Collection Time: 08/02/19  8:11 AM  Result Value Ref Range   Color, Urine STRAW (A) YELLOW   APPearance CLEAR CLEAR   Specific Gravity, Urine 1.021 1.005 - 1.030   pH 5.0 5.0 - 8.0   Glucose, UA >=500 (A) NEGATIVE mg/dL   Hgb urine dipstick NEGATIVE NEGATIVE   Bilirubin Urine NEGATIVE NEGATIVE   Ketones, ur 5 (A) NEGATIVE mg/dL   Protein, ur NEGATIVE NEGATIVE mg/dL   Nitrite NEGATIVE NEGATIVE   Leukocytes,Ua NEGATIVE NEGATIVE   RBC / HPF 0-5 0 - 5 RBC/hpf   WBC, UA 0-5 0 - 5 WBC/hpf   Bacteria, UA NONE SEEN NONE SEEN    Comment: Performed at [facility] [address] [place], [place] [address]  CBG monitoring, ED     Status: Abnormal   Collection Time: 08/02/19 10:47 AM  Result Value Ref Range   Glucose-Capillary >600 (HH) 70 - 99 mg/dL  Hemoglobin A1c     Status: Abnormal   Collection Time: 08/02/19 10:48 AM  Result Value Ref Range   Hgb A1c MFr Bld 12.9 (H) 4.8 - 5.6 %    Comment: (NOTE) Pre diabetes:          5.7%-6.4% Diabetes:              >6.4% Glycemic control for   <7.0% adults with diabetes    Mean Plasma Glucose 323.53 mg/dL    Comment: Performed at [facility] [address], [place], [place] [address]  CBG monitoring, ED     Status: Abnormal   Collection Time: 08/02/19 11:51 AM  Result Value Ref Range   Glucose-Capillary 589 (HH) 70 - 99 mg/dL   Comment 1 Notify RN   CBG monitoring, ED     Status: Abnormal   Collection Time: 08/02/19  1:04 PM  Result Value Ref Range   Glucose-Capillary 506 (HH) 70 - 99 mg/dL   Comment 1 Notify RN   POC CBG, ED     Status: Abnormal   Collection Time: 08/02/19  2:08 PM  Result Value Ref Range   Glucose-Capillary 512 (HH) 70 - 99 mg/dL   Comment 1 Notify RN   Basic metabolic panel     Status: Abnormal   Collection Time: 08/02/19  2:30 PM  Result Value Ref Range   Sodium 133 (L) 135 - 145 mmol/L   Potassium 4.0 3.5 - 5.1 mmol/L   Chloride 104 98 - 111 mmol/L   CO2 17 (L) 22 - 32 mmol/L   Glucose, Bld 424 (H) 70 - 99 mg/dL   BUN 25 (H) 6 - 20 mg/dL   Creatinine, Ser 1.12 0.61 - 1.24 mg/dL   Calcium 7.6 (L) 8.9 - 10.3 mg/dL   GFR calc non Af Amer >60 >60 mL/min   GFR calc Af Amer >60 >60 mL/min   Anion gap 12 5 - 15    Comment: Performed at [facility] [address], [place], [place] [address]   No results found.  Pending Labs Unresulted Labs (From admission, onward)    Start     Ordered   08/02/19 1609  SARS CORONAVIRUS 2 Nasal Swab Aptima Multi Swab  (Asymptomatic/Tier 2 Patients Labs)  Once,   STAT    Question Answer Comment  Is this test  for diagnosis or screening Screening   Symptomatic for COVID-19 as defined by CDC No   Hospitalized for COVID-19 No   Admitted to ICU for COVID-19 No   Previously tested for COVID-19 No   Resident in a congregate (group) care setting No   Employed in healthcare setting No      08/02/19 1609   08/02/19 1558  HIV antibody (Routine Testing)  Once,   STAT     08/02/19 1558   08/02/19 1558  Basic metabolic panel  STAT Now then every 4 hours ,   STAT     08/02/19 1558   08/02/19 1558  Urine rapid drug screen (hosp performed)not at [facility]  Once,   STAT     08/02/19 1558          Vitals/Pain Today's Vitals   08/02/19 0806 08/02/19 1035 08/02/19 1100  BP: (!) 176/92 (!) 143/100 (!) 147/96  Pulse: (!) 105 95 96  Resp: 18 18 18   Temp: 98.5 F (36.9 C)    TempSrc: Oral    SpO2: 97% 95% 97%  PainSc: 0-No pain      Isolation Precautions No active isolations  Medications Medications  living well with diabetes book MISC (has no administration in time range)  allopurinol (ZYLOPRIM) tablet 300 mg (has no administration in time range)  amLODipine (NORVASC) tablet 5 mg (has no administration in time range)  enoxaparin (LOVENOX) injection 40 mg (has no administration in time range)  0.9 %  sodium chloride infusion (has no administration in time range)  0.9 %  sodium chloride infusion (has no administration in time range)  dextrose 5 %-0.45 % sodium chloride infusion (has no administration in time range)  potassium chloride 10 mEq in 100 mL IVPB (has no administration in time range)  insulin regular, human (MYXREDLIN) 100 units/ 100 mL infusion (has no administration in time range)  sodium chloride 0.9 % bolus 1,000 mL (1,000 mLs Intravenous New Bag/Given 08/02/19 1047)  insulin aspart (novoLOG) injection 10 Units (10 Units Intravenous Given 08/02/19 1212)  sodium chloride 0.9 % bolus 1,000 mL (1,000 mLs Intravenous New Bag/Given 08/02/19 1212)  insulin aspart (novoLOG) injection 10 Units (10  Units Intravenous Given 08/02/19 1427)    Mobility walks Low fall risk   Focused Assessments    R Recommendations: See Admitting Provider Note  Report given to:   Additional Notes:

## 2019-08-03 DIAGNOSIS — M109 Gout, unspecified: Secondary | ICD-10-CM

## 2019-08-03 DIAGNOSIS — R739 Hyperglycemia, unspecified: Secondary | ICD-10-CM

## 2019-08-03 DIAGNOSIS — E1165 Type 2 diabetes mellitus with hyperglycemia: Secondary | ICD-10-CM

## 2019-08-03 DIAGNOSIS — I10 Essential (primary) hypertension: Secondary | ICD-10-CM

## 2019-08-03 LAB — BASIC METABOLIC PANEL
Anion gap: 11 (ref 5–15)
Anion gap: 8 (ref 5–15)
Anion gap: 10 (ref 5–15)
BUN: 19 mg/dL (ref 6–20)
BUN: 16 mg/dL (ref 6–20)
BUN: 15 mg/dL (ref 6–20)
CO2: 24 mmol/L (ref 22–32)
CO2: 28 mmol/L (ref 22–32)
CO2: 24 mmol/L (ref 22–32)
Calcium: 9.1 mg/dL (ref 8.9–10.3)
Calcium: 9.2 mg/dL (ref 8.9–10.3)
Calcium: 8.9 mg/dL (ref 8.9–10.3)
Chloride: 102 mmol/L (ref 98–111)
Chloride: 104 mmol/L (ref 98–111)
Chloride: 104 mmol/L (ref 98–111)
Creatinine, Ser: 1.13 mg/dL (ref 0.61–1.24)
Creatinine, Ser: 1.17 mg/dL (ref 0.61–1.24)
Creatinine, Ser: 1.13 mg/dL (ref 0.61–1.24)
GFR calc Af Amer: >60 mL/min (ref >60)
GFR calc Af Amer: >60 mL/min (ref >60)
GFR calc Af Amer: >60 mL/min (ref >60)
GFR calc non Af Amer: >60 mL/min (ref >60)
GFR calc non Af Amer: >60 mL/min (ref >60)
GFR calc non Af Amer: >60 mL/min (ref >60)
Glucose, Bld: 235 mg/dL — ABNORMAL HIGH (ref 70–99)
Glucose, Bld: 166 mg/dL — ABNORMAL HIGH (ref 70–99)
Glucose, Bld: 175 mg/dL — ABNORMAL HIGH (ref 70–99)
Potassium: 4 mmol/L (ref 3.5–5.1)
Potassium: 4.1 mmol/L (ref 3.5–5.1)
Potassium: 3.8 mmol/L (ref 3.5–5.1)
Sodium: 137 mmol/L (ref 135–145)
Sodium: 140 mmol/L (ref 135–145)
Sodium: 138 mmol/L (ref 135–145)

## 2019-08-03 LAB — GLUCOSE, CAPILLARY
Glucose-Capillary: 153 mg/dL — ABNORMAL HIGH (ref 70–99)
Glucose-Capillary: 160 mg/dL — ABNORMAL HIGH (ref 70–99)
Glucose-Capillary: 161 mg/dL — ABNORMAL HIGH (ref 70–99)
Glucose-Capillary: 163 mg/dL — ABNORMAL HIGH (ref 70–99)
Glucose-Capillary: 165 mg/dL — ABNORMAL HIGH (ref 70–99)
Glucose-Capillary: 167 mg/dL — ABNORMAL HIGH (ref 70–99)
Glucose-Capillary: 172 mg/dL — ABNORMAL HIGH (ref 70–99)
Glucose-Capillary: 177 mg/dL — ABNORMAL HIGH (ref 70–99)
Glucose-Capillary: 187 mg/dL — ABNORMAL HIGH (ref 70–99)
Glucose-Capillary: 190 mg/dL — ABNORMAL HIGH (ref 70–99)
Glucose-Capillary: 214 mg/dL — ABNORMAL HIGH (ref 70–99)
Glucose-Capillary: 215 mg/dL — ABNORMAL HIGH (ref 70–99)
Glucose-Capillary: 262 mg/dL — ABNORMAL HIGH (ref 70–99)

## 2019-08-03 LAB — HIV ANTIBODY (ROUTINE TESTING W REFLEX): HIV Screen 4th Generation wRfx: NONREACTIVE

## 2019-08-03 LAB — SARS CORONAVIRUS 2 (TAT 6-24 HRS): SARS Coronavirus 2: NEGATIVE

## 2019-08-03 MED ORDER — INSULIN GLARGINE 100 UNIT/ML ~~LOC~~ SOLN
15.0000 [IU] | Freq: Once | SUBCUTANEOUS | Status: AC
  Administered 2019-08-03: 15 [IU] via SUBCUTANEOUS
  Filled 2019-08-03: qty 0.15

## 2019-08-03 MED ORDER — BLOOD GLUCOSE METER KIT: PACK | 0 refills | Status: AC

## 2019-08-03 MED ORDER — NOVOLIN 70/30 FLEXPEN RELION (70-30) 100 UNIT/ML ~~LOC~~ SUPN: 25.0000 [IU] | PEN_INJECTOR | Freq: Two times a day (BID) | SUBCUTANEOUS | 2 refills | Status: DC

## 2019-08-03 MED ORDER — INSULIN PEN NEEDLE 31G X 5 MM MISC: 1 refills | Status: AC

## 2019-08-03 MED ORDER — INSULIN ASPART 100 UNIT/ML ~~LOC~~ SOLN
0.0000 [IU] | Freq: Three times a day (TID) | SUBCUTANEOUS | Status: DC
  Administered 2019-08-03: 12:00:00 8 [IU] via SUBCUTANEOUS

## 2019-08-03 MED ORDER — INSULIN ASPART PROT & ASPART (70-30 MIX) 100 UNIT/ML ~~LOC~~ SUSP
25.0000 [IU] | Freq: Two times a day (BID) | SUBCUTANEOUS | Status: DC
  Filled 2019-08-03: qty 10

## 2019-08-03 MED ORDER — AMLODIPINE BESYLATE 5 MG PO TABS: 5.0000 mg | ORAL_TABLET | Freq: Every day | ORAL | 0 refills | Status: DC

## 2019-08-03 MED FILL — NOVOLIN 70/30 100 UNITS/ML: (70-30) 100 | 20 days supply | Qty: 10 | Fill #0

## 2019-08-03 MED FILL — ULTICARE SYR 0.5 ML 31GX5/1: 31G X 5/16" | 20 days supply | Qty: 40 | Fill #0

## 2019-08-03 NOTE — TOC Initial Note (Addendum)
Transition of Care [facility]) - Initial/Assessment Note    Patient Details  Name: Christopher Hays MRN: [id number] Date of Birth: Sep 16, 1986  [facility] CM/SW Contact:    Sherrilyn Rist Advanced Care Supervisor Phone Number: 08/03/2019, 10:22 AM  Clinical Narrative:                 CM talked to patient at the bedside; Pt lives at home with spouse and 3 children; PCP is Dr Tereasa Coop with [facility]; no medical insurance; pt stated that he completed a Medicaid application a month ago and has information for an [employer] to assist with Medication; pharmacy of choice is [facility] on [address]; possibly need the [employer] ( Medication Assistance Through [employer]) at discharge. No problems with transportaton; unemployed at this time; pt screened for the [facility] 360 program to assist him as needed; pt stated that he did not need any further assistance at this time; pt has a glucometer meter at the bedside - pt stated that the Diabetic Coordinator gave it to home to take home. Levada Dy CM [phone]) will continue to follow for progression of care. Wife to provide transportation home at discharge.   Expected Discharge Plan: Home/Self Care Barriers to Discharge: No Barriers Identified   Patient Goals and CMS Choice     Choice offered to / list presented to : Spouse  Expected Discharge Plan and Services Expected Discharge Plan: Home/Self Care In-house Referral: Financial Counselor Discharge Planning Services: CM Consult Post Acute Care Choice: NA Living arrangements for the past 2 months: Apartment                           HH Arranged: NA          Prior Living Arrangements/Services Living arrangements for the past 2 months: Apartment Lives with:: Minor Children, Spouse Patient language and need for interpreter reviewed:: No        Need for Family Participation in Patient Care: No (Comment) Care giver support system in place?: Yes (comment)    Criminal Activity/Legal Involvement Pertinent to Current Situation/Hospitalization: No - Comment as needed  Activities of Daily Living      Permission Sought/Granted                  Emotional Assessment Appearance:: Appears older than stated age Attitude/Demeanor/Rapport: Charismatic Affect (typically observed): Pleasant Orientation: : Oriented to Self, Oriented to Place, Oriented to  Time, Oriented to Situation Alcohol / Substance Use: Not Applicable Psych Involvement: No (comment)  Admission diagnosis:  Hypochloremia [E87.8] Hyponatremia [E87.1] Hyperglycemia [R73.9] Diabetes mellitus, new onset ([place]) [E11.9] Patient Active Problem List   Diagnosis Date Noted  . Essential hypertension 08/02/2019  . Diabetes mellitus with hyperglycemia, without long-term current use of insulin ([place]) 08/02/2019  . Obesity, Class III, BMI 40-49.9 (morbid obesity) ([place]) 08/02/2019  . Gout 06/06/2013   PCP:  Patient, No Pcp Per Pharmacy:   [facility] [place], [place] [address] [place] [address] Phone: [phone] Fax: [phone]     Social Determinants of Health (SDOH) Interventions    Readmission Risk Interventions No flowsheet data found.

## 2019-08-03 NOTE — Discharge Summary (Signed)
Physician Discharge Summary  Christopher Hays GTX:[id number] DOB: 1986-09-21 DOA: 08/02/2019  PCP: Sue Lush, PA-C  Admit date: 08/02/2019 Discharge date: 08/03/2019  Time spent: 45 minutes  Recommendations for Outpatient Follow-up:  Patient will be discharged to home.  Patient will need to follow up with primary care provider within one week of discharge.  Patient should continue medications as prescribed.  Patient should follow a heart healthy/carb modified diet.   Discharge Diagnoses:  New onset diabetes mellitus, type II Essential hypertension Gout Obesity  Discharge Condition: Stable  Diet recommendation: heart healthy/carb modified  Filed Weights   08/02/19 1739  Weight: (!) 152.9 kg    History of present illness:  On 08/02/2019 by Dr. Karmen Bongo Christopher Hays is a 33 y.o. male with medical history significant of HTN presenting with hyperglycemia.   He has had "crazy fatigue, urination was like every 20 minutes", numbness in his hands, polyuria, blurred vision.  His symptoms started a week ago Monday and he finally went to his PCP yesterday.  He had blood work and they called him about 3AM and finally got in touch with them about 6AM and told him to come to the ER.  No symptoms prior to last Monday.  He has lost maybe 20 pounds in 2 weeks.  He was having diarrhea.    Hospital Course:  New onset diabetes mellitus, type II -Patient presented with hyperglycemia, 675, mildly elevated anion gap 16, however not acidotic  -Was placed on insulin drip and transition to 70/30 -Hemoglobin A1c 12.9 -Diabetes coordinator consulted and appreciated, had extensive conversation with patient regarding diet as well as medications -Will discharge patient with Novolin 70/30, 25 units twice daily along with ReliOn glucometer -Case management consulted and has set patient up with diabetes clinic  Essential hypertension -Continue amlodipine -patient may benefit from ACEi, however, to be  discussed with PCP  Gout -Continue allopurinol  Obesity -BMI 47.  Patient to follow-up with his primary care physician to discuss lifestyle modifications  Procedures: none  Consultations: none  Discharge Exam:  08/03/19 0808  BP: 139/86  Pulse: 86  Resp:   Temp: 98.9 F (37.2 C)  SpO2: 97%     General: Well developed, well nourished, NAD, appears stated age  HEENT: NCAT,  mucous membranes moist.  Cardiovascular: S1 S2 auscultated, RRR, no murmur  Respiratory: Clear to auscultation bilaterally with equal chest rise  Abdomen: Soft, obese, nontender, nondistended, + bowel sounds  Extremities: warm dry without cyanosis clubbing or edema  Neuro: AAOx3, nonfocal  Psych: Normal affect and demeanor   Discharge Instructions Discharge Instructions    Discharge instructions   Complete by: As directed    Patient will be discharged to home.  Patient will need to follow up with primary care provider within one week of discharge.  Patient should continue medications as prescribed.  Patient should follow a heart healthy/carb modified diet.     Allergies as of 08/03/2019   No Known Allergies     Medication List    TAKE these medications   allopurinol 300 MG tablet Commonly known as: ZYLOPRIM Take 300 mg by mouth daily as needed (For Gout).   amLODipine 5 MG tablet Commonly known as: NORVASC Take 1 tablet (5 mg total) by mouth daily.   blood glucose meter kit and supplies Dispense based on patient and insurance preference. Use up to four times daily as directed. (FOR ICD-10 E10.9, E11.9).   Insulin Pen Needle 31G X 5 MM Misc Use with  insulin pen   NovoLIN 70/30 FlexPen Relion (70-30) 100 UNIT/ML PEN Generic drug: Insulin Isophane & Regular Human Inject 25 Units into the skin 2 (two) times daily.      No Known Allergies Follow-up Information    Sue Lush, PA-C. Schedule an appointment as soon as possible for a visit in 1 week(s).   Specialty: Physician  Assistant Why: Hospital follow up Contact information: [address] [place] [address] [phone]            The results of significant diagnostics from this hospitalization (including imaging, microbiology, ancillary and laboratory) are listed below for reference.    Significant Diagnostic Studies: No results found.  Microbiology: Recent Results (from the past 240 hour(s))  SARS CORONAVIRUS 2 Nasal Swab Aptima Multi Swab     Status: None   Collection Time: 08/02/19  5:30 PM   Specimen: Aptima Multi Swab; Nasal Swab  Result Value Ref Range Status   SARS Coronavirus 2 NEGATIVE NEGATIVE Final    Comment: (NOTE) SARS-CoV-2 target nucleic acids are NOT DETECTED. The SARS-CoV-2 RNA is generally detectable in upper and lower respiratory specimens during the acute phase of infection. Negative results do not preclude SARS-CoV-2 infection, do not rule out co-infections with other pathogens, and should not be used as the sole basis for treatment or other patient management decisions. Negative results must be combined with clinical observations, patient history, and epidemiological information. The expected result is Negative. Fact Sheet for Patients: SugarRoll.be Fact Sheet for Healthcare Providers: https://www.woods-mathews.com/ This test is not yet approved or cleared by the [place] FDA and  has been authorized for detection and/or diagnosis of SARS-CoV-2 by FDA under an Emergency Use Authorization (EUA). This EUA will remain  in effect (meaning this test can be used) for the duration of the COVID-19 declaration under Section 56 4(b)(1) of the Act, 21 U.S.C. section 360bbb-3(b)(1), unless the authorization is terminated or revoked sooner. Performed at [facility], [place] [address], [place], [place] [address]      Labs: Basic Metabolic Panel: Recent Labs  Lab 08/02/19 1723 08/02/19 1936 08/03/19  0012 08/03/19 0401 08/03/19 0815  NA 131* 133* 137 140 138  K 4.8 4.6 4.0 4.1 3.8  CL 96* 97* 102 104 104  CO2 21* 21* 24 28 24   GLUCOSE 438* 424* 235* 166* 175*  BUN 25* 22* 19 16 15   CREATININE 1.38* 1.30* 1.13 1.17 1.13  CALCIUM 9.0 9.2 9.1 9.2 8.9   Liver Function Tests: No results for input(s): AST, ALT, ALKPHOS, BILITOT, PROT, ALBUMIN in the last 168 hours. No results for input(s): LIPASE, AMYLASE in the last 168 hours. No results for input(s): AMMONIA in the last 168 hours. CBC: Recent Labs  Lab 08/02/19 0811  WBC 5.5  HGB 14.6  HCT 42.6  MCV 77.7*  PLT 244   Cardiac Enzymes: No results for input(s): CKTOTAL, CKMB, CKMBINDEX, TROPONINI in the last 168 hours. BNP: BNP (last 3 results) No results for input(s): BNP in the last 8760 hours.  ProBNP (last 3 results) No results for input(s): PROBNP in the last 8760 hours.  CBG: Recent Labs  Lab 08/03/19 0800 08/03/19 0910 08/03/19 1007 08/03/19 1121 08/03/19 1201  GLUCAP 177* 165* 172* 190* 262*       Signed:  Kaivon Hays  [facility] 08/03/2019, 1:13 PM

## 2019-08-03 NOTE — Discharge Planning (Signed)
Nsg Discharge Note  Admit Date:  08/02/2019 Discharge date: 08/03/2019   Christopher Hays to be D/C'd Home per MD order.  AVS completed.    Discharge Medication: Allergies as of 08/03/2019   No Known Allergies     Medication List    TAKE these medications   allopurinol 300 MG tablet Commonly known as: ZYLOPRIM Take 300 mg by mouth daily as needed (For Gout).   amLODipine 5 MG tablet Commonly known as: NORVASC Take 1 tablet (5 mg total) by mouth daily.   blood glucose meter kit and supplies Dispense based on patient and insurance preference. Use up to four times daily as directed. (FOR ICD-10 E10.9, E11.9).   Insulin Pen Needle 31G X 5 MM Misc Use with insulin pen   NovoLIN 70/30 FlexPen Relion (70-30) 100 UNIT/ML PEN Generic drug: Insulin Isophane & Regular Human Inject 25 Units into the skin 2 (two) times daily.       Discharge Assessment: Vitals:   08/03/19 0808 08/03/19 1155  BP: 139/86 (!) 150/77  Pulse: 86 97  Resp:    Temp: 98.9 F (37.2 C) 98 F (36.7 C)  SpO2: 97% 98%   Skin clean, dry and intact without evidence of skin break down, no evidence of skin tears noted. IV catheter discontinued intact. Site without signs and symptoms of complications - no redness or edema noted at insertion site, patient denies c/o pain - only slight tenderness at site.  Dressing with slight pressure applied.  D/c Instructions-Education: Discharge instructions given to patient/family with verbalized understanding. D/c education completed with patient/family including follow up instructions, medication list, d/c activities limitations if indicated, with other d/c instructions as indicated by MD - patient able to verbalize understanding, all questions fully answered. Patient instructed to return to ED, call 911, or call MD for any changes in condition.  Patient escorted via [place], and D/C home via private auto.  Hiram Comber, RN 08/03/2019 2:58 PM

## 2019-08-03 NOTE — Progress Notes (Signed)
NCM received consult:New DM clinic follow up with med assistance. Thanks! Pt without job, states no insurance, limited income. Pt will probably need Match Letter to assist with medication needs.  NCM will continue to follow for TOC needs. Whitman Hero RN,BSN,CM

## 2019-08-03 NOTE — Progress Notes (Signed)
Inpatient Diabetes Program Recommendations  AACE/ADA: New Consensus Statement on Inpatient Glycemic Control (2015)  Target Ranges:  Prepandial:   less than 140 mg/dL      Peak postprandial:   less than 180 mg/dL (1-2 hours)      Critically ill patients:  140 - 180 mg/dL   Lab Results  Component Value Date   GLUCAP 165 (H) 08/03/2019   HGBA1C 12.9 (H) 08/02/2019    Review of Glycemic Control  Parameters met for transitioning off insulin drip to SQ insulin. New diagnosis DM2 with HgbA1C of 12.9%. In December 2017, HgbA1C was 6.4% and was diagnosed with Impaired Fasting Glucose.   Inpatient Diabetes Program Recommendations:     Transition to 70/30 25 units bid. Give 70/30 1-2 hours prior to discontinuation of drip.  Novolog 0-9 units tidwc and 0-5 units QHS  See Progress Note dated 08/02/19 by Diabetes Coordinator Tama Headings, RN, regarding recs for discharge.   RN to allow pt to stick finger for monitoring and give himself insulin injections. Pt prefers to be discharged on insulin pen. Was given blood glucose monitor.   To f/u with PCP for management of DM within 1 week of discharge. Check blood sugars 3-4x/day and record in logbook. Take logbook to MD visit for review.  Will call and speak with pt to see if he has any other questions regarding new diagnosis of DM and HgbA1C of 12.9%  Thank you. Lorenda Peck, RD, LDN, CDE Inpatient Diabetes Coordinator [phone]

## 2019-08-03 NOTE — Progress Notes (Signed)
Lantus administered. Will continue with insulin gtt for next two hours then will discontinue. Patient ordered breakfast tray. Will continue to monitor.  Hiram Comber, RN 08/03/2019 10:14 AM

## 2019-08-03 NOTE — TOC Transition Note (Signed)
Transition of Care [facility]) - CM/SW Discharge Note   Patient Details  Name: Christopher Hays MRN: [id number] Date of Birth: Jan 11, 1986  [facility] CM/SW Contact:  Sharin Mons, RN Phone Number: 08/03/2019, 3:58 PM   Clinical Narrative:     Patient will DC UU:VOZD with wife Anticipated DC date: 08/03/2019 Family notified: wife Transport by: wife  Per MD patient ready for DC today. RN, patient, patient's wife aware of d/c plan.Pt without insurance states unable to afford Rx meds. Match Letter given and explained  to assist with medcost. NCM shared [place]. Pt and wife voiced interest. Hospital follow up scheduled for [facility] and noted on AVS. Pt very appreciative. Wife to provide transportation to home.  RNCM will sign off for now as intervention is no longer needed. Please consult [place] again if new needs arise.    Final next level of care: Home/Self Care Barriers to Discharge: No Barriers Identified   Patient Goals and CMS Choice     Choice offered to / list presented to : Spouse  Discharge Placement                       Discharge Plan and Services In-house Referral: Financial Counselor Discharge Planning Services: CM Consult Post Acute Care Choice: NA                    HH Arranged: NA          Social Determinants of Health (SDOH) Interventions     Readmission Risk Interventions No flowsheet data found.

## 2019-08-03 NOTE — Discharge Instructions (Signed)
Blood Glucose Monitoring, Adult °Monitoring your blood sugar (glucose) is an important part of managing your diabetes (diabetes mellitus). Blood glucose monitoring involves checking your blood glucose as often as directed and keeping a record (log) of your results over time. °Checking your blood glucose regularly and keeping a blood glucose log can: °· Help you and your health care provider adjust your diabetes management plan as needed, including your medicines or insulin. °· Help you understand how food, exercise, illnesses, and medicines affect your blood glucose. °· Let you know what your blood glucose is at any time. You can quickly find out if you have low blood glucose (hypoglycemia) or high blood glucose (hyperglycemia). °Your health care provider will set individualized treatment goals for you. Your goals will be based on your age, other medical conditions you have, and how you respond to diabetes treatment. Generally, the goal of treatment is to maintain the following blood glucose levels: °· Before meals (preprandial): 80-130 mg/dL (4.4-7.2 mmol/L). °· After meals (postprandial): below 180 mg/dL (10 mmol/L). °· A1c level: less than 7%. °Supplies needed: °· Blood glucose meter. °· Test strips for your meter. Each meter has its own strips. You must use the strips that came with your meter. °· A needle to prick your finger (lancet). Do not use a lancet more than one time. °· A device that holds the lancet (lancing device). °· A journal or log book to write down your results. °How to check your blood glucose ° °1. Wash your hands with soap and water. °2. Prick the side of your finger (not the tip) with the lancet. Use a different finger each time. °3. Gently rub the finger until a small drop of blood appears. °4. Follow instructions that come with your meter for inserting the test strip, applying blood to the strip, and using your blood glucose meter. °5. Write down your result and any notes. °Some meters  allow you to use areas of your body other than your finger (alternative sites) to test your blood. The most common alternative sites are: °· Forearm. °· Thigh. °· Palm of the hand. °If you think you may have hypoglycemia, or if you have a history of not knowing when your blood glucose is getting low (hypoglycemia unawareness), do not use alternative sites. Use your finger instead. Alternative sites may not be as accurate as the fingers, because blood flow is slower in these areas. This means that the result you get may be delayed, and it may be different from the result that you would get from your finger. °Follow these instructions at home: °Blood glucose log ° °· Every time you check your blood glucose, write down your result. Also write down any notes about things that may be affecting your blood glucose, such as your diet and exercise for the day. This information can help you and your health care provider: °? Look for patterns in your blood glucose over time. °? Adjust your diabetes management plan as needed. °· Check if your meter allows you to download your records to a computer. Most glucose meters store a record of glucose readings in the meter. °If you have type 1 diabetes: °· Check your blood glucose 2 or more times a day. °· Also check your blood glucose: °? Before every insulin injection. °? Before and after exercise. °? Before meals. °? 2 hours after a meal. °? Occasionally between 2:00 a.m. and 3:00 a.m., as directed. °? Before potentially dangerous tasks, like driving or using heavy machinery. °?   At bedtime.  You may need to check your blood glucose more often, up to 6-10 times a day, if you: ? Use an insulin pump. ? Need multiple daily injections (MDI). ? Have diabetes that is not well-controlled. ? Are ill. ? Have a history of severe hypoglycemia. ? Have hypoglycemia unawareness. If you have type 2 diabetes:  If you take insulin or other diabetes medicines, check your blood glucose 2 or  more times a day.  If you are on intensive insulin therapy, check your blood glucose 4 or more times a day. Occasionally, you may also need to check between 2:00 a.m. and 3:00 a.m., as directed.  Also check your blood glucose: ? Before and after exercise. ? Before potentially dangerous tasks, like driving or using heavy machinery.  You may need to check your blood glucose more often if: ? Your medicine is being adjusted. ? Your diabetes is not well-controlled. ? You are ill. General tips  Always keep your supplies with you.  If you have questions or need help, all blood glucose meters have a 24-hour "hotline" phone number that you can call. You may also contact your health care provider.  After you use a few boxes of test strips, adjust (calibrate) your blood glucose meter by following instructions that came with your meter. Contact a health care provider if:  Your blood glucose is at or above 240 mg/dL (16.1 mmol/L) for 2 days in a row.  You have been sick or have had a fever for 2 days or longer, and you are not getting better.  You have any of the following problems for more than 6 hours: ? You cannot eat or drink. ? You have nausea or vomiting. ? You have diarrhea. Get help right away if:  Your blood glucose is lower than 54 mg/dL (3 mmol/L).  You become confused or you have trouble thinking clearly.  You have difficulty breathing.  You have moderate or large ketone levels in your urine. Summary  Monitoring your blood sugar (glucose) is an important part of managing your diabetes (diabetes mellitus).  Blood glucose monitoring involves checking your blood glucose as often as directed and keeping a record (log) of your results over time.  Your health care provider will set individualized treatment goals for you. Your goals will be based on your age, other medical conditions you have, and how you respond to diabetes treatment.  Every time you check your blood glucose,  write down your result. Also write down any notes about things that may be affecting your blood glucose, such as your diet and exercise for the day. This information is not intended to replace advice given to you by your health care provider. Make sure you discuss any questions you have with your health care provider. Document Released: 12/02/2003 Document Revised: 09/22/2018 Document Reviewed: 05/10/2016 Elsevier Patient Education  2020 [employer].   Diabetes Mellitus and Nutrition, Adult When you have diabetes (diabetes mellitus), it is very important to have healthy eating habits because your blood sugar (glucose) levels are greatly affected by what you eat and drink. Eating healthy foods in the appropriate amounts, at about the same times every day, can help you:  Control your blood glucose.  Lower your risk of heart disease.  Improve your blood pressure.  Reach or maintain a healthy weight. Every person with diabetes is different, and each person has different needs for a meal plan. Your health care provider may recommend that you work with a diet  and nutrition specialist (dietitian) to make a meal plan that is best for you. Your meal plan may vary depending on factors such as:  The calories you need.  The medicines you take.  Your weight.  Your blood glucose, blood pressure, and cholesterol levels.  Your activity level.  Other health conditions you have, such as heart or kidney disease. How do carbohydrates affect me? Carbohydrates, also called carbs, affect your blood glucose level more than any other type of food. Eating carbs naturally raises the amount of glucose in your blood. Carb counting is a method for keeping track of how many carbs you eat. Counting carbs is important to keep your blood glucose at a healthy level, especially if you use insulin or take certain oral diabetes medicines. It is important to know how many carbs you can safely have in each meal. This is  different for every person. Your dietitian can help you calculate how many carbs you should have at each meal and for each snack. Foods that contain carbs include:  Bread, cereal, rice, pasta, and crackers.  Potatoes and corn.  Peas, beans, and lentils.  Milk and yogurt.  Fruit and juice.  Desserts, such as cakes, cookies, ice cream, and candy. How does alcohol affect me? Alcohol can cause a sudden decrease in blood glucose (hypoglycemia), especially if you use insulin or take certain oral diabetes medicines. Hypoglycemia can be a life-threatening condition. Symptoms of hypoglycemia (sleepiness, dizziness, and confusion) are similar to symptoms of having too much alcohol. If your health care provider says that alcohol is safe for you, follow these guidelines:  Limit alcohol intake to no more than 1 drink per day for nonpregnant women and 2 drinks per day for men. One drink equals 12 oz of beer, 5 oz of wine, or 1 oz of hard liquor.  Do not drink on an empty stomach.  Keep yourself hydrated with water, diet soda, or unsweetened iced tea.  Keep in mind that regular soda, juice, and other mixers may contain a lot of sugar and must be counted as carbs. What are tips for following this plan?  Reading food labels  Start by checking the serving size on the "Nutrition Facts" label of packaged foods and drinks. The amount of calories, carbs, fats, and other nutrients listed on the label is based on one serving of the item. Many items contain more than one serving per package.  Check the total grams (g) of carbs in one serving. You can calculate the number of servings of carbs in one serving by dividing the total carbs by 15. For example, if a food has 30 g of total carbs, it would be equal to 2 servings of carbs.  Check the number of grams (g) of saturated and trans fats in one serving. Choose foods that have low or no amount of these fats.  Check the number of milligrams (mg) of salt  (sodium) in one serving. Most people should limit total sodium intake to less than 2,300 mg per day.  Always check the nutrition information of foods labeled as "low-fat" or "nonfat". These foods may be higher in added sugar or refined carbs and should be avoided.  Talk to your dietitian to identify your daily goals for nutrients listed on the label. Shopping  Avoid buying canned, premade, or processed foods. These foods tend to be high in fat, sodium, and added sugar.  Shop around the outside edge of the grocery store. This includes fresh fruits and vegetables,  bulk grains, fresh meats, and fresh dairy. Cooking  Use low-heat cooking methods, such as baking, instead of high-heat cooking methods like deep frying.  Cook using healthy oils, such as olive, canola, or sunflower oil.  Avoid cooking with butter, cream, or high-fat meats. Meal planning  Eat meals and snacks regularly, preferably at the same times every day. Avoid going long periods of time without eating.  Eat foods high in fiber, such as fresh fruits, vegetables, beans, and whole grains. Talk to your dietitian about how many servings of carbs you can eat at each meal.  Eat 4-6 ounces (oz) of lean protein each day, such as lean meat, chicken, fish, eggs, or tofu. One oz of lean protein is equal to: ? 1 oz of meat, chicken, or fish. ? 1 egg. ?  cup of tofu.  Eat some foods each day that contain healthy fats, such as avocado, nuts, seeds, and fish. Lifestyle  Check your blood glucose regularly.  Exercise regularly as told by your health care provider. This may include: ? 150 minutes of moderate-intensity or vigorous-intensity exercise each week. This could be brisk walking, biking, or water aerobics. ? Stretching and doing strength exercises, such as yoga or weightlifting, at least 2 times a week.  Take medicines as told by your health care provider.  Do not use any products that contain nicotine or tobacco, such as  cigarettes and e-cigarettes. If you need help quitting, ask your health care provider.  Work with a Veterinary surgeoncounselor or diabetes educator to identify strategies to manage stress and any emotional and social challenges. Questions to ask a health care provider  Do I need to meet with a diabetes educator?  Do I need to meet with a dietitian?  What number can I call if I have questions?  When are the best times to check my blood glucose? Where to find more information:  American Diabetes Association: diabetes.org  Academy of Nutrition and Dietetics: www.eatright.[employer]  National Institute of Diabetes and Digestive and Kidney Diseases (NIH): [employer].tnwww.niddk.nih.gov Summary  A healthy meal plan will help you control your blood glucose and maintain a healthy lifestyle.  Working with a diet and nutrition specialist (dietitian) can help you make a meal plan that is best for you.  Keep in mind that carbohydrates (carbs) and alcohol have immediate effects on your blood glucose levels. It is important to count carbs and to use alcohol carefully. This information is not intended to replace advice given to you by your health care provider. Make sure you discuss any questions you have with your health care provider. Document Released: 08/26/2005 Document Revised: 11/11/2017 Document Reviewed: 01/03/2017 Elsevier Patient Education  2020 [employer]   Diabetes Mellitus and Exercise Exercising regularly is important for your overall health, especially when you have diabetes (diabetes mellitus). Exercising is not only about losing weight. It has many other health benefits, such as increasing muscle strength and bone density and reducing body fat and stress. This leads to improved fitness, flexibility, and endurance, all of which result in better overall health. Exercise has additional benefits for people with diabetes, including:  Reducing appetite.  Helping to lower and control blood glucose.  Lowering blood  pressure.  Helping to control amounts of fatty substances (lipids) in the blood, such as cholesterol and triglycerides.  Helping the body to respond better to insulin (improving insulin sensitivity).  Reducing how much insulin the body needs.  Decreasing the risk for heart disease by: ? Lowering cholesterol and triglyceride levels. ?  Increasing the levels of good cholesterol. ? Lowering blood glucose levels. What is my activity plan? Your health care provider or certified diabetes educator can help you make a plan for the type and frequency of exercise (activity plan) that works for you. Make sure that you:  Do at least 150 minutes of moderate-intensity or vigorous-intensity exercise each week. This could be brisk walking, biking, or water aerobics. ? Do stretching and strength exercises, such as yoga or weightlifting, at least 2 times a week. ? Spread out your activity over at least 3 days of the week.  Get some form of physical activity every day. ? Do not go more than 2 days in a row without some kind of physical activity. ? Avoid being inactive for more than 30 minutes at a time. Take frequent breaks to walk or stretch.  Choose a type of exercise or activity that you enjoy, and set realistic goals.  Start slowly, and gradually increase the intensity of your exercise over time. What do I need to know about managing my diabetes?   Check your blood glucose before and after exercising. ? If your blood glucose is 240 mg/dL (16.113.3 mmol/L) or higher before you exercise, check your urine for ketones. If you have ketones in your urine, do not exercise until your blood glucose returns to normal. ? If your blood glucose is 100 mg/dL (5.6 mmol/L) or lower, eat a snack containing 15-20 grams of carbohydrate. Check your blood glucose 15 minutes after the snack to make sure that your level is above 100 mg/dL (5.6 mmol/L) before you start your exercise.  Know the symptoms of low blood glucose  (hypoglycemia) and how to treat it. Your risk for hypoglycemia increases during and after exercise. Common symptoms of hypoglycemia can include: ? Hunger. ? Anxiety. ? Sweating and feeling clammy. ? Confusion. ? Dizziness or feeling light-headed. ? Increased heart rate or palpitations. ? Blurry vision. ? Tingling or numbness around the mouth, lips, or tongue. ? Tremors or shakes. ? Irritability.  Keep a rapid-acting carbohydrate snack available before, during, and after exercise to help prevent or treat hypoglycemia.  Avoid injecting insulin into areas of the body that are going to be exercised. For example, avoid injecting insulin into: ? The arms, when playing tennis. ? The legs, when jogging.  Keep records of your exercise habits. Doing this can help you and your health care provider adjust your diabetes management plan as needed. Write down: ? Food that you eat before and after you exercise. ? Blood glucose levels before and after you exercise. ? The type and amount of exercise you have done. ? When your insulin is expected to peak, if you use insulin. Avoid exercising at times when your insulin is peaking.  When you start a new exercise or activity, work with your health care provider to make sure the activity is safe for you, and to adjust your insulin, medicines, or food intake as needed.  Drink plenty of water while you exercise to prevent dehydration or heat stroke. Drink enough fluid to keep your urine clear or pale yellow. Summary  Exercising regularly is important for your overall health, especially when you have diabetes (diabetes mellitus).  Exercising has many health benefits, such as increasing muscle strength and bone density and reducing body fat and stress.  Your health care provider or certified diabetes educator can help you make a plan for the type and frequency of exercise (activity plan) that works for you.  When you  start a new exercise or activity, work  with your health care provider to make sure the activity is safe for you, and to adjust your insulin, medicines, or food intake as needed. This information is not intended to replace advice given to you by your health care provider. Make sure you discuss any questions you have with your health care provider. Document Released: 02/19/2004 Document Revised: 06/23/2017 Document Reviewed: 05/10/2016 Elsevier Patient Education  2020 [employer]

## 2019-08-03 NOTE — Progress Notes (Signed)
Inpatient Diabetes Program Recommendations  AACE/ADA: New Consensus Statement on Inpatient Glycemic Control (2015)  Target Ranges:  Prepandial:   less than 140 mg/dL      Peak postprandial:   less than 180 mg/dL (1-2 hours)      Critically ill patients:  140 - 180 mg/dL   Lab Results  Component Value Date   GLUCAP 262 (H) 08/03/2019   HGBA1C 12.9 (H) 08/02/2019    Review of Glycemic Control  Spoke with pt about going home on insulin, hypoglycemia s/s and treatment, and importance of monitoring blood sugars 3-4x/day. Instructed to take logbook/meter to PCP appt next week. Stressed importance of lifestyle modification with diet, weight loss, and starting exercise program. We discussed goal of making body use own insulin by losing weight and being active. Pt has no questions and feel competent in checking blood sugars and giving his insulin.  Spoke with RN regarding same.   Thank you. Lorenda Peck, RD, LDN, CDE Inpatient Diabetes Coordinator [phone]

## 2019-08-03 NOTE — Plan of Care (Signed)
  Problem: Education: Goal: Ability to describe self-care measures that may prevent or decrease complications (Diabetes Survival Skills Education) will improve Outcome: Progressing   Problem: Metabolic: Goal: Ability to maintain appropriate glucose levels will improve Outcome: Progressing   

## 2019-08-29 ENCOUNTER — Inpatient Hospital Stay: Payer: Self-pay | Admitting: Family Medicine

## 2020-04-06 IMAGING — CR DG CHEST 2V
2 series · 2 of 2 positions shown · non-contrast
Comparison: 10/18/2017

CLINICAL DATA: Chest pain radiating to the back for 1 week.
Shortness of breath and diaphoresis.

EXAM:
CHEST - 2 VIEW

[chest pa]
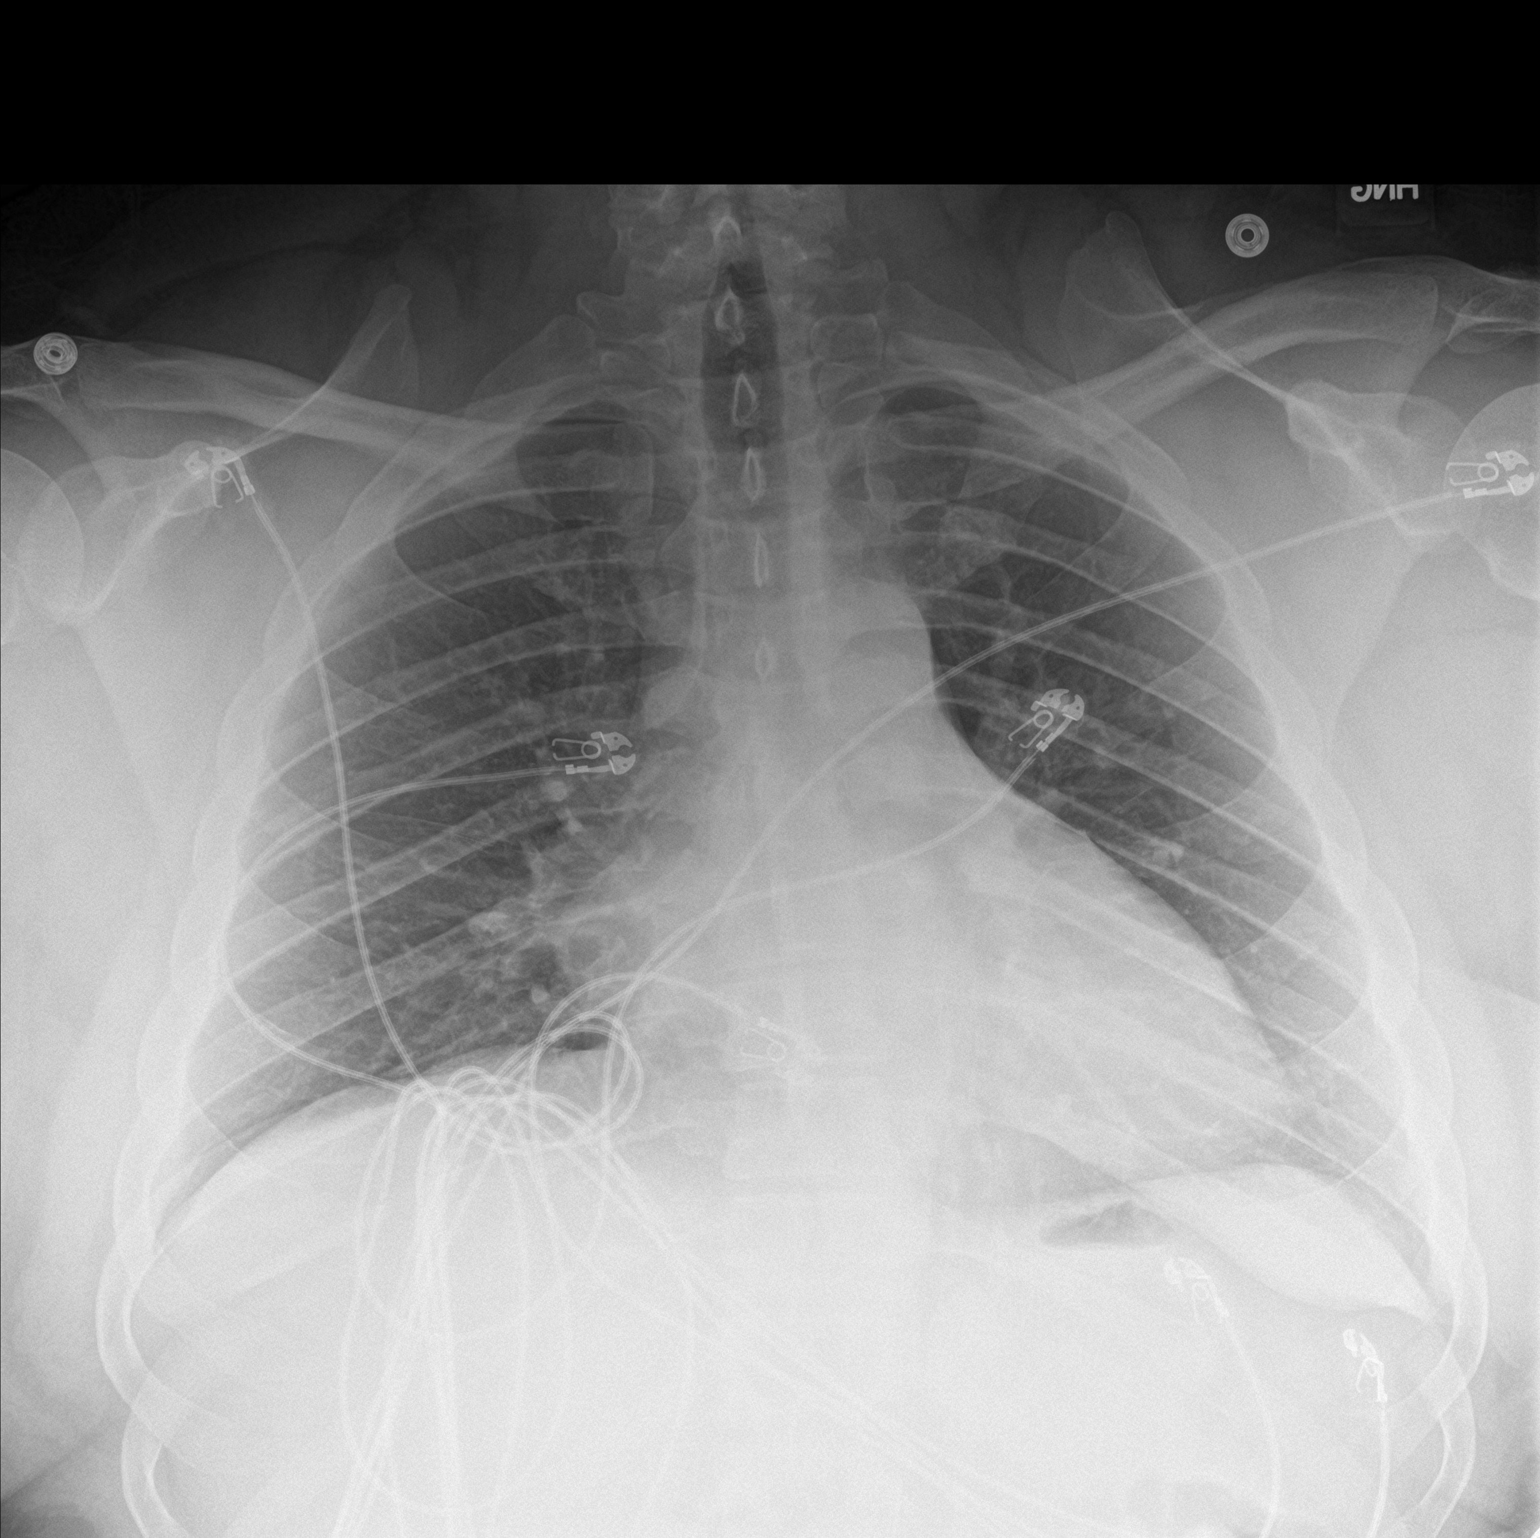

[chest lat]
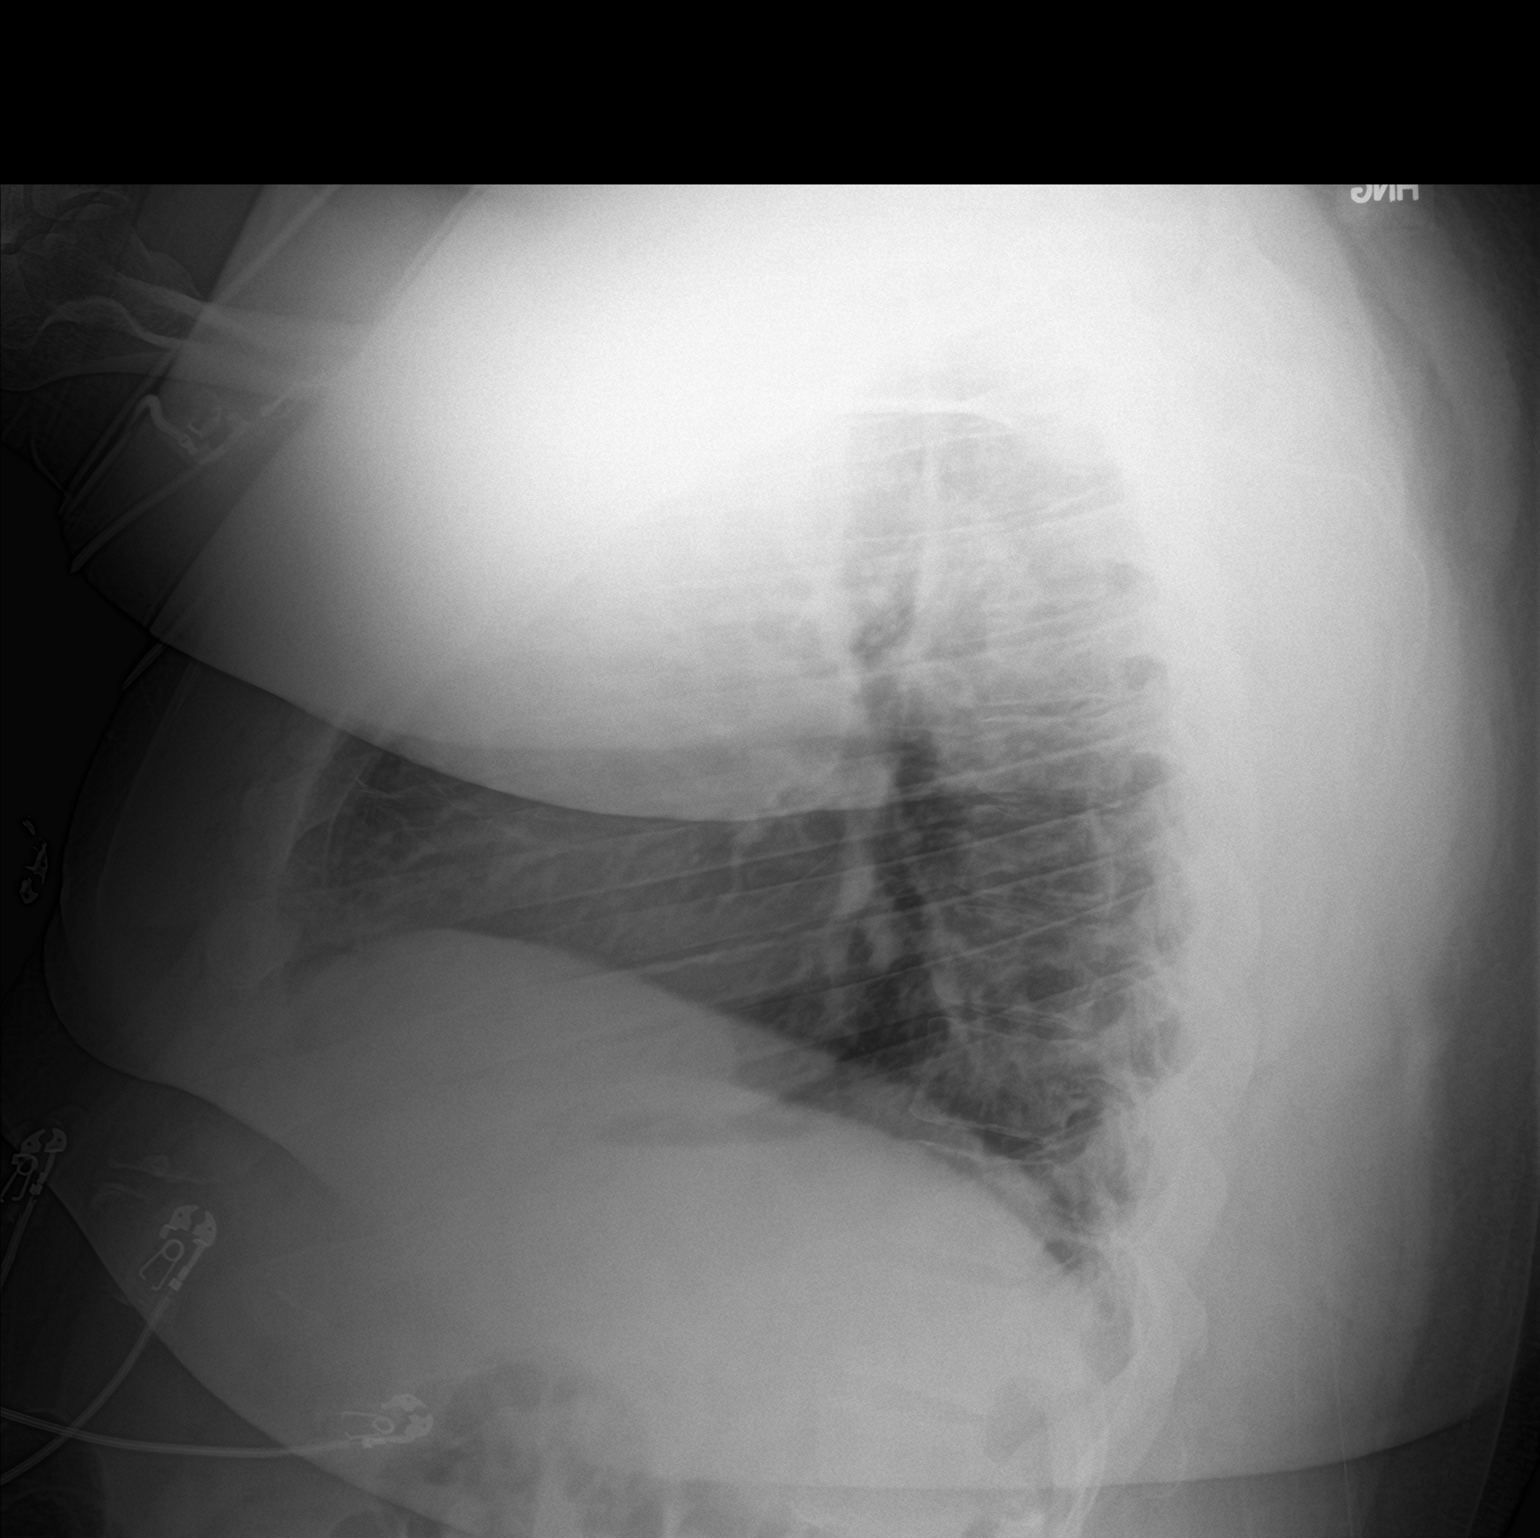

[2 of 2 positions shown; findings below may reference images not displayed]

FINDINGS: Cardiac enlargement. Normal pulmonary vascularity. No airspace
disease or consolidation. No blunting of costophrenic angles. No
pneumothorax. Mediastinal contours appear intact. No change.
IMPRESSION: Cardiac enlargement. No evidence of active pulmonary disease.

## 2020-09-17 ENCOUNTER — Other Ambulatory Visit: Payer: Self-pay

## 2020-09-17 ENCOUNTER — Encounter (HOSPITAL_COMMUNITY): Payer: Self-pay

## 2020-09-17 ENCOUNTER — Emergency Department (HOSPITAL_COMMUNITY)
Admission: EM | Admit: 2020-09-17 | Discharge: 2020-09-17 | Disposition: A | Discharge status: Home / Self Care | Payer: Medicaid Other | Attending: Emergency Medicine | Admitting: Emergency Medicine

## 2020-09-17 DIAGNOSIS — I10 Essential (primary) hypertension: Secondary | ICD-10-CM | POA: Diagnosis not present

## 2020-09-17 DIAGNOSIS — Z79899 Other long term (current) drug therapy: Secondary | ICD-10-CM | POA: Diagnosis not present

## 2020-09-17 DIAGNOSIS — E1165 Type 2 diabetes mellitus with hyperglycemia: Secondary | ICD-10-CM | POA: Insufficient documentation

## 2020-09-17 DIAGNOSIS — R739 Hyperglycemia, unspecified: Secondary | ICD-10-CM | POA: Diagnosis present

## 2020-09-17 DIAGNOSIS — Z794 Long term (current) use of insulin: Secondary | ICD-10-CM | POA: Diagnosis not present

## 2020-09-17 LAB — URINALYSIS, ROUTINE W REFLEX MICROSCOPIC
Bacteria, UA: NONE SEEN
Bilirubin Urine: NEGATIVE
Glucose, UA: >=500 mg/dL — AB
Hgb urine dipstick: NEGATIVE
Ketones, ur: NEGATIVE mg/dL
Leukocytes,Ua: NEGATIVE
Nitrite: NEGATIVE
Protein, ur: NEGATIVE mg/dL
Specific Gravity, Urine: 1.02 (ref 1.005–1.030)
pH: 5 (ref 5.0–8.0)

## 2020-09-17 LAB — BASIC METABOLIC PANEL
Anion gap: 13 (ref 5–15)
BUN: 26 mg/dL — ABNORMAL HIGH (ref 6–20)
CO2: 25 mmol/L (ref 22–32)
Calcium: 9.1 mg/dL (ref 8.9–10.3)
Chloride: 90 mmol/L — ABNORMAL LOW (ref 98–111)
Creatinine, Ser: 1.27 mg/dL — ABNORMAL HIGH (ref 0.61–1.24)
GFR calc non Af Amer: >60 mL/min (ref >60)
Glucose, Bld: 699 mg/dL (ref 70–99)
Potassium: 5.2 mmol/L — ABNORMAL HIGH (ref 3.5–5.1)
Sodium: 128 mmol/L — ABNORMAL LOW (ref 135–145)

## 2020-09-17 LAB — CBC
HCT: 38.6 % — ABNORMAL LOW (ref 39.0–52.0)
Hemoglobin: 12.9 g/dL — ABNORMAL LOW (ref 13.0–17.0)
MCH: 25.6 pg — ABNORMAL LOW (ref 26.0–34.0)
MCHC: 33.4 g/dL (ref 30.0–36.0)
MCV: 76.7 fL — ABNORMAL LOW (ref 80.0–100.0)
Platelets: 276 10*3/uL (ref 150–400)
RBC: 5.03 MIL/uL (ref 4.22–5.81)
RDW: 13.4 % (ref 11.5–15.5)
WBC: 5.5 10*3/uL (ref 4.0–10.5)
nRBC: 0 % (ref 0.0–0.2)

## 2020-09-17 LAB — CBG MONITORING, ED
Glucose-Capillary: >600 mg/dL (ref 70–99)
Glucose-Capillary: 487 mg/dL — ABNORMAL HIGH (ref 70–99)
Glucose-Capillary: 438 mg/dL — ABNORMAL HIGH (ref 70–99)

## 2020-09-17 MED ORDER — INSULIN ASPART 100 UNIT/ML ~~LOC~~ SOLN
10.0000 [IU] | Freq: Once | SUBCUTANEOUS | Status: AC
  Administered 2020-09-17: 10 [IU] via SUBCUTANEOUS

## 2020-09-17 MED ORDER — DEXTROSE 50 % IV SOLN: 0.0000 mL | INTRAVENOUS | Status: DC | PRN

## 2020-09-17 MED ORDER — INSULIN REGULAR(HUMAN) IN NACL 100-0.9 UT/100ML-% IV SOLN
INTRAVENOUS | Status: DC
  Filled 2020-09-17: qty 100

## 2020-09-17 MED ORDER — SODIUM CHLORIDE 0.9 % IV BOLUS
2000.0000 mL | Freq: Once | INTRAVENOUS | Status: AC
  Administered 2020-09-17: 2000 mL via INTRAVENOUS

## 2020-09-17 MED ORDER — LACTATED RINGERS IV SOLN: INTRAVENOUS | Status: DC

## 2020-09-17 NOTE — ED Notes (Signed)
Discharge instructions discussed with pt. Pt verbalized understanding. Pt stable and ambulatory. No signature pad available. 

## 2020-09-17 NOTE — Discharge Instructions (Signed)
Please take your Toujeo and Metformin as directed and monitor your blood sugar closely if it continues to elevate you will need to return to the hospital otherwise please follow-up with your primary care provider as planned.

## 2020-09-17 NOTE — ED Triage Notes (Signed)
Pt reports hyperglycemia, non compliant with medications. CBG reading HIGH in triage. Pt has no complaints.

## 2020-09-17 NOTE — ED Provider Notes (Signed)
[facility] EMERGENCY DEPARTMENT Provider Note   CSN: [id number] Arrival date & time: 09/17/20  1520     History Chief Complaint  Patient presents with  . Hyperglycemia    Christopher Hays is a 34 y.o. male.  Christopher Hays is a 34 y.o. male with a history of diabetes and hyperlipidemia, who presents from his PCPs office for evaluation of hyperglycemia. Patient reports yesterday when he checked his sugar it read high so he saw his PCP and they gave him medications and then had him come back today but his blood sugar was till too high despite this so he was sent to the ED. He has had some polyuria and polydipsia, but denies other associated symptoms. No nausea, vomiting, abdominal pain, chest pain or shortness of breath. No fevers or chills. No headaches or vision changes. States that he take Tujeo daily and was supposed to be taking metformin as well but there was some confusion with pharmacies and he had not been taking this medication for several months until yesterday. Does not regularly check his blood sugar        Past Medical History:  Diagnosis Date  . Gout   . Hypertension     Patient Active Problem List   Diagnosis Date Noted  . Essential hypertension 08/02/2019  . Diabetes mellitus with hyperglycemia, without long-term current use of insulin ([place]) 08/02/2019  . Obesity, Class III, BMI 40-49.9 (morbid obesity) ([place]) 08/02/2019  . Gout 06/06/2013    No past surgical history on file.     Family History  Problem Relation Age of Onset  . Gout Paternal Grandmother   . Gout Paternal Grandfather   . Diabetes Neg Hx     Social History   Tobacco Use  . Smoking status: Never Smoker  . Smokeless tobacco: Never Used  Substance Use Topics  . Alcohol use: Yes    Comment: occasional   . Drug use: No    Home Medications Prior to Admission medications   Medication Sig Start Date End Date Taking? Authorizing Provider  allopurinol (ZYLOPRIM) 300 MG tablet  Take 300 mg by mouth 2 (two) times daily.  06/29/18  Yes [provider]  amLODipine (NORVASC) 10 MG tablet Take 10 mg by mouth daily. 07/23/20  Yes [provider]  cetirizine (ZYRTEC) 10 MG tablet Take 10 mg by mouth daily as needed for allergies.  09/14/20  Yes [provider]  Cyanocobalamin (VITAMIN B-12) 2500 MCG SUBL Place 1 tablet under the tongue daily.   Yes [provider]  metFORMIN (GLUCOPHAGE) 1000 MG tablet Take 1,000 mg by mouth 2 (two) times daily. 09/16/20  Yes [provider]  pantoprazole (PROTONIX) 40 MG tablet Take 40 mg by mouth daily. 07/23/20  Yes [provider]  rosuvastatin (CRESTOR) 20 MG tablet Take 20 mg by mouth at bedtime. 07/23/20  Yes [provider]  telmisartan (MICARDIS) 80 MG tablet Take 80 mg by mouth daily. 07/30/20  Yes [provider]  TOUJEO SOLOSTAR 300 UNIT/ML Solostar Pen Inject 25 Units into the skin at bedtime.  09/09/20  Yes [provider]  blood glucose meter kit and supplies Dispense based on patient and insurance preference. Use up to four times daily as directed. (FOR ICD-10 E10.9, E11.9). 08/03/19   Mikhail, Christopher Addison, DO  Insulin Isophane & Regular Human (NOVOLIN 70/30 FLEXPEN RELION) (70-30) 100 UNIT/ML PEN Inject 25 Units into the skin 2 (two) times daily. Patient not taking: Reported on 09/17/2020 08/03/19  Mikhail, Christopher Addison, DO  Insulin Pen Needle 31G X 5 MM MISC Use with insulin pen 08/03/19   Cristal Ford, DO    Allergies    Patient has no known allergies.  Review of Systems   Review of Systems  Constitutional: Negative for chills, fatigue and fever.  HENT: Negative.   Eyes: Negative for visual disturbance.  Respiratory: Negative for cough and shortness of breath.   Cardiovascular: Negative for chest pain.  Gastrointestinal: Negative for abdominal pain, nausea and vomiting.  Endocrine: Positive for polydipsia and polyuria.  Genitourinary: Negative for  dysuria.  Musculoskeletal: Negative for myalgias.  Skin: Negative for color change and rash.  Neurological: Negative for dizziness, syncope, light-headedness and headaches.    Physical Exam Updated Vital Signs BP (!) 150/90 (BP Location: Right Arm)   Pulse (!) 117   Temp 99 F (37.2 C) (Oral)   Resp 20   Ht 5' 11"  (1.803 m)   Wt (!) 147.9 kg   SpO2 99%   BMI 45.47 kg/m   Physical Exam Vitals and nursing note reviewed.  Constitutional:      General: He is not in acute distress.    Appearance: Normal appearance. He is well-developed. He is obese. He is not ill-appearing or diaphoretic.     Comments: Well-appearing and in no distress   HENT:     Head: Normocephalic and atraumatic.     Mouth/Throat:     Mouth: Mucous membranes are moist.     Pharynx: Oropharynx is clear.  Eyes:     General:        Right eye: No discharge.        Left eye: No discharge.     Pupils: Pupils are equal, round, and reactive to light.  Cardiovascular:     Rate and Rhythm: Normal rate and regular rhythm.     Heart sounds: Normal heart sounds.  Pulmonary:     Effort: Pulmonary effort is normal. No respiratory distress.     Breath sounds: Normal breath sounds. No wheezing or rales.     Comments: Respirations equal and unlabored, patient able to speak in full sentences, lungs clear to auscultation bilaterally Abdominal:     General: Bowel sounds are normal. There is no distension.     Palpations: Abdomen is soft. There is no mass.     Tenderness: There is no abdominal tenderness. There is no guarding.     Comments: Abdomen soft, nondistended, nontender to palpation in all quadrants without guarding or peritoneal signs  Musculoskeletal:        General: No deformity.     Cervical back: Neck supple.  Skin:    General: Skin is warm and dry.     Capillary Refill: Capillary refill takes less than 2 seconds.  Neurological:     Mental Status: He is alert.     Coordination: Coordination normal.      Comments: Speech is clear, able to follow commands Moves extremities without ataxia, coordination intact  Psychiatric:        Mood and Affect: Mood normal.        Behavior: Behavior normal.     ED Results / Procedures / Treatments   Labs (all labs ordered are listed, but only abnormal results are displayed) Labs Reviewed  BASIC METABOLIC PANEL - Abnormal; Notable for the following components:      Result Value   Sodium 128 (*)    Potassium 5.2 (*)    Chloride 90 (*)  Glucose, Bld 699 (*)    BUN 26 (*)    Creatinine, Ser 1.27 (*)    All other components within normal limits  CBC - Abnormal; Notable for the following components:   Hemoglobin 12.9 (*)    HCT 38.6 (*)    MCV 76.7 (*)    MCH 25.6 (*)    All other components within normal limits  URINALYSIS, ROUTINE W REFLEX MICROSCOPIC - Abnormal; Notable for the following components:   Color, Urine COLORLESS (*)    Glucose, UA >=500 (*)    All other components within normal limits  CBG MONITORING, ED - Abnormal; Notable for the following components:   Glucose-Capillary >600 (*)    All other components within normal limits  CBG MONITORING, ED    EKG None  Radiology No results found.  Procedures Procedures (including critical care time)  Medications Ordered in ED Medications - No data to display  ED Course  I have reviewed the triage vital signs and the nursing notes.  Pertinent labs & imaging results that were available during my care of the patient were reviewed by me and considered in my medical decision making (see chart for details).    MDM Rules/Calculators/A&P                          34 yo M presents with hyperglycemia, in the setting of medication non-compliance, he does not check his sugar regularly, pt is asymptomatic and PCP attempted to manage as an outpatient but glucose remained high. Lab work inititated from triage which shows glucose of 699, but normal anion gap and CO2, no signs of DKA. Mildly  elevated potassium, and hyponatremia, in the setting of hyperglycemia, slight bump in creatinine. Will treat with 2L fluid bolus and insulin drip to bring sugar down to a monitorable level.  After 2L bolus glucose improved to 487 prior to starting insulin drip, so will give SQ insulin instead.  After 10 units glucose continuing to improve, now 438, will give additional 10 units and then discharge home. Discussed importance of taking medications as directed and recommend close PCP follow up, return precautions provided. Pt discharged home in good condition.  Final Clinical Impression(s) / ED Diagnoses Final diagnoses:  Hyperglycemia    Rx / DC Orders ED Discharge Orders    None       Janet Berlin 09/18/20 1147    Blanchie Dessert, MD 09/18/20 2321

## 2022-03-11 ENCOUNTER — Other Ambulatory Visit: Payer: Self-pay

## 2022-03-11 ENCOUNTER — Emergency Department (HOSPITAL_COMMUNITY)
Admission: EM | Admit: 2022-03-11 | Discharge: 2022-03-11 | Disposition: A | Discharge status: Home / Self Care | Payer: Medicaid Other | Attending: Emergency Medicine | Admitting: Emergency Medicine

## 2022-03-11 DIAGNOSIS — Z7984 Long term (current) use of oral hypoglycemic drugs: Secondary | ICD-10-CM | POA: Insufficient documentation

## 2022-03-11 DIAGNOSIS — Z794 Long term (current) use of insulin: Secondary | ICD-10-CM | POA: Diagnosis not present

## 2022-03-11 DIAGNOSIS — R739 Hyperglycemia, unspecified: Secondary | ICD-10-CM | POA: Diagnosis present

## 2022-03-11 DIAGNOSIS — E1165 Type 2 diabetes mellitus with hyperglycemia: Secondary | ICD-10-CM | POA: Diagnosis not present

## 2022-03-11 LAB — BASIC METABOLIC PANEL
Anion gap: 10 (ref 5–15)
BUN: 21 mg/dL — ABNORMAL HIGH (ref 6–20)
CO2: 24 mmol/L (ref 22–32)
Calcium: 9 mg/dL (ref 8.9–10.3)
Chloride: 98 mmol/L (ref 98–111)
Creatinine, Ser: 1.17 mg/dL (ref 0.61–1.24)
GFR, Estimated: >60 mL/min (ref >60)
Glucose, Bld: 542 mg/dL (ref 70–99)
Potassium: 4.2 mmol/L (ref 3.5–5.1)
Sodium: 132 mmol/L — ABNORMAL LOW (ref 135–145)

## 2022-03-11 LAB — CBC
HCT: 39.8 % (ref 39.0–52.0)
Hemoglobin: 13.8 g/dL (ref 13.0–17.0)
MCH: 26.7 pg (ref 26.0–34.0)
MCHC: 34.7 g/dL (ref 30.0–36.0)
MCV: 77 fL — ABNORMAL LOW (ref 80.0–100.0)
Platelets: 243 10*3/uL (ref 150–400)
RBC: 5.17 MIL/uL (ref 4.22–5.81)
RDW: 13 % (ref 11.5–15.5)
WBC: 5.9 10*3/uL (ref 4.0–10.5)
nRBC: 0 % (ref 0.0–0.2)

## 2022-03-11 LAB — URINALYSIS, ROUTINE W REFLEX MICROSCOPIC
Bacteria, UA: NONE SEEN
Bilirubin Urine: NEGATIVE
Glucose, UA: >=500 mg/dL — AB
Hgb urine dipstick: NEGATIVE
Ketones, ur: NEGATIVE mg/dL
Leukocytes,Ua: NEGATIVE
Nitrite: NEGATIVE
Protein, ur: NEGATIVE mg/dL
Specific Gravity, Urine: 1.023 (ref 1.005–1.030)
pH: 5 (ref 5.0–8.0)

## 2022-03-11 LAB — CBG MONITORING, ED
Glucose-Capillary: 510 mg/dL (ref 70–99)
Glucose-Capillary: 422 mg/dL — ABNORMAL HIGH (ref 70–99)
Glucose-Capillary: 351 mg/dL — ABNORMAL HIGH (ref 70–99)

## 2022-03-11 MED ORDER — SODIUM CHLORIDE 0.9 % IV BOLUS
1000.0000 mL | Freq: Once | INTRAVENOUS | Status: AC
  Administered 2022-03-11: 1000 mL via INTRAVENOUS

## 2022-03-11 MED ORDER — TOUJEO SOLOSTAR 300 UNIT/ML ~~LOC~~ SOPN: 25.0000 [IU] | PEN_INJECTOR | Freq: Every day | SUBCUTANEOUS | 0 refills | Status: DC

## 2022-03-11 MED ORDER — INSULIN ASPART 100 UNIT/ML IJ SOLN
5.0000 [IU] | Freq: Once | INTRAMUSCULAR | Status: AC
  Administered 2022-03-11: 5 [IU] via SUBCUTANEOUS

## 2022-03-11 MED ORDER — INSULIN ASPART 100 UNIT/ML IJ SOLN: 5.0000 [IU] | Freq: Once | INTRAMUSCULAR | Status: DC

## 2022-03-11 NOTE — ED Provider Notes (Signed)
?  Physical Exam  ?BP (!) 147/90   Pulse 91   Temp 98.2 ?F (36.8 ?C)   Resp 19   SpO2 96%  ? ?Physical Exam ? ?Procedures  ?Procedures ? ?ED Course / MDM  ?  ?Medical Decision Making ?Risk ?Prescription drug management. ? ? ?Received care of patient from Dr. Lockie Mola. Please see his note for prior history, physical and care.  Briefly this is a 36 year old male who presented to the emergency department due to hyperglycemia with a glucose of over 500.  Notes his most recent hemoglobin A1c was 15.  Lab work does not show signs of DKA.  His glucose has improved to 351 and he has been hydrated with fluid.  The diabetic coordinator discussed therapies with him, with plan for him to start the Trulicity that he was just prescribed and given a prescription for Toujeo which she had been previously taking at bedtime.  Recommend continued close follow-up with his primary care physician. Patient discharged in stable condition with understanding of reasons to return.  ? ? ? ? ?  ?Alvira Monday, MD ?03/13/22 1044 ? ?

## 2022-03-11 NOTE — ED Provider Triage Note (Signed)
Emergency Medicine Provider Triage Evaluation Note ? ?Christopher Hays , a 36 y.o. male  was evaluated in triage.  Pt sent here for abnormal labs. Hx of noncompliance with medications for diabetes, HTN and HLD. Was seen at PCP yesterday and was found to have CBG of 575.  ? ?Review of Systems  ?Positive: "Feeling off" ?Negative: Headache, lightheadedness, N/V/D, abdominal pain ? ?Physical Exam  ?BP (!) 157/98   Pulse 95   Temp 98.2 ?F (36.8 ?C)   Resp 16   SpO2 97%  ?Gen:   Awake, no distress   ?Resp:  Normal effort  ?MSK:   Moves extremities without difficulty  ?Other:   ? ?Medical Decision Making  ?Medically screening exam initiated at 1:56 PM.  Appropriate orders placed.  Christopher Hays was informed that the remainder of the evaluation will be completed by another provider, this initial triage assessment does not replace that evaluation, and the importance of remaining in the ED until their evaluation is complete. ? ? ?  ?Kateri Plummer, PA-C ?03/11/22 1356 ? ?

## 2022-03-11 NOTE — Progress Notes (Addendum)
Inpatient Diabetes Program Recommendations ? ?AACE/ADA: New Consensus Statement on Inpatient Glycemic Control (2015) ? ?Target Ranges:  Prepandial:   less than 140 mg/dL ?     Peak postprandial:   less than 180 mg/dL (1-2 hours) ?     Critically ill patients:  140 - 180 mg/dL  ? ?Lab Results  ?Component Value Date  ? GLUCAP 510 (HH) 03/11/2022  ? HGBA1C 12.9 (H) 08/02/2019  ? ? ?Review of Glycemic Control ? ?Diabetes history: type 2 ?Outpatient Diabetes medications: None for the last year; (prior medications)Toujeo 25 units at HS, Metformin 1000 mg BID, new prescription for Trulicity (not filled yet) ?Current orders for Inpatient glycemic control: none ? ?Inpatient Diabetes Program Recommendations:   ?Spoke with patient on the phone in regards to diabetes coordinator consult. Patient was sent to ED by PCP due to high blood sugar taken yesterday. Current CBG on admission was 542 mg/dl. Patient has been off all diabetes medications for about 1 year. States that Metformin 1000 mg BID upset his stomach. The Toujeo was stopped due to better CBGs at the time.  ? ?Patient states that his current HgbA1C is 15.5% after blood work taken yesterday at PCP office.In discussing his medications, patient is willing to take Toujeo 25 units at [facility] and start the Trulicity as prescribed yesterday by PCP. He could take Metformin XR which might be easier on the stomach. Otherwise, will need to follow up with PCP for further blood glucose control and medications. Patient may be interested in outpatient [facility] classes or individual consults for diet and general diabetes care. ? ?Spoke with Dr. Dalene Seltzer in ED in regards to what the patient was willing to do with his medications. Patient needs to follow up with PCP.  ? ?Smith Mince RN BSN CDE ?Diabetes Coordinator ?Pager: [phone]  8am-5pm  ? ? ? ?

## 2022-03-11 NOTE — ED Provider Notes (Signed)
?[facility] ?Provider Note ? ? ?CSN: [id number] ?Arrival date & time: 03/11/22  1338 ? ?  ? ?History ? ?Chief Complaint  ?Patient presents with  ? Hyperglycemia  ? ? ?Christopher Hays is a 36 y.o. male. ? ?The history is provided by the patient.  ?Hyperglycemia ?Blood sugar level PTA:  >500 ?Severity:  Mild ?Onset quality:  Gradual ?Timing:  Constant ?Progression:  Unchanged ?Chronicity:  New ?Context: noncompliance   ?Relieved by:  Nothing ?Ineffective treatments:  None tried ?Associated symptoms: no abdominal pain, no altered mental status, no blurred vision, no chest pain, no confusion, no dehydration, no diaphoresis, no dizziness, no dysuria, no fatigue, no fever, no nausea, no polyuria, no shortness of breath, no syncope, no weakness and no weight change   ?Risk factors: no hx of DKA   ? ?  ? ?Home Medications ?Prior to Admission medications   ?Medication Sig Start Date End Date Taking? Authorizing Provider  ?allopurinol (ZYLOPRIM) 300 MG tablet Take 300 mg by mouth 2 (two) times daily.  06/29/18   [provider]  ?amLODipine (NORVASC) 10 MG tablet Take 10 mg by mouth daily. 07/23/20   [provider]  ?blood glucose meter kit and supplies Dispense based on patient and insurance preference. Use up to four times daily as directed. (FOR ICD-10 E10.9, E11.9). 08/03/19   Cristal Ford, DO  ?cetirizine (ZYRTEC) 10 MG tablet Take 10 mg by mouth daily as needed for allergies.  09/14/20   [provider]  ?Cyanocobalamin (VITAMIN B-12) 2500 MCG SUBL Place 1 tablet under the tongue daily.    [provider]  ?Insulin Isophane & Regular Human (NOVOLIN 70/30 FLEXPEN RELION) (70-30) 100 UNIT/ML PEN Inject 25 Units into the skin 2 (two) times daily. ?Patient not taking: Reported on 09/17/2020 08/03/19   Cristal Ford, DO  ?Insulin Pen Needle 31G X 5 MM MISC Use with insulin pen 08/03/19   Cristal Ford, DO  ?metFORMIN (GLUCOPHAGE) 1000 MG tablet Take  1,000 mg by mouth 2 (two) times daily. 09/16/20   [provider]  ?pantoprazole (PROTONIX) 40 MG tablet Take 40 mg by mouth daily. 07/23/20   [provider]  ?rosuvastatin (CRESTOR) 20 MG tablet Take 20 mg by mouth at bedtime. 07/23/20   [provider]  ?telmisartan (MICARDIS) 80 MG tablet Take 80 mg by mouth daily. 07/30/20   [provider]  ?Obie Dredge 300 UNIT/ML Solostar Pen Inject 25 Units into the skin at bedtime.  09/09/20   [provider]  ?   ? ?Allergies    ?Patient has no known allergies.   ? ?Review of Systems   ?Review of Systems  ?Constitutional:  Negative for diaphoresis, fatigue and fever.  ?Eyes:  Negative for blurred vision.  ?Respiratory:  Negative for shortness of breath.   ?Cardiovascular:  Negative for chest pain and syncope.  ?Gastrointestinal:  Negative for abdominal pain and nausea.  ?Endocrine: Negative for polyuria.  ?Genitourinary:  Negative for dysuria.  ?Neurological:  Negative for dizziness and weakness.  ?Psychiatric/Behavioral:  Negative for confusion.   ? ?Physical Exam ?Updated Vital Signs ?BP (!) 157/98   Pulse 95   Temp 98.2 ?F (36.8 ?C)   Resp 16   SpO2 97%  ?Physical Exam ?Vitals and nursing note reviewed.  ?Constitutional:   ?   General: He is not in acute distress. ?   Appearance: He is well-developed.  ?HENT:  ?   Head: Normocephalic and atraumatic.  ?  Nose: Nose normal.  ?   Mouth/Throat:  ?   Mouth: Mucous membranes are moist.  ?Eyes:  ?   Extraocular Movements: Extraocular movements intact.  ?   Conjunctiva/sclera: Conjunctivae normal.  ?   Pupils: Pupils are equal, round, and reactive to light.  ?Cardiovascular:  ?   Rate and Rhythm: Normal rate and regular rhythm.  ?   Pulses: Normal pulses.  ?   Heart sounds: Normal heart sounds. No murmur heard. ?Pulmonary:  ?   Effort: Pulmonary effort is normal. No respiratory distress.  ?   Breath sounds: Normal breath sounds.  ?Abdominal:  ?   General: Abdomen is flat.  ?    Palpations: Abdomen is soft.  ?   Tenderness: There is no abdominal tenderness.  ?Musculoskeletal:     ?   General: No swelling.  ?   Cervical back: Neck supple.  ?Skin: ?   General: Skin is warm and dry.  ?   Capillary Refill: Capillary refill takes less than 2 seconds.  ?Neurological:  ?   General: No focal deficit present.  ?   Mental Status: He is alert.  ?Psychiatric:     ?   Mood and Affect: Mood normal.  ? ? ?ED Results / Procedures / Treatments   ?Labs ?(all labs ordered are listed, but only abnormal results are displayed) ?Labs Reviewed  ?BASIC METABOLIC PANEL - Abnormal; Notable for the following components:  ?    Result Value  ? Sodium 132 (*)   ? Glucose, Bld 542 (*)   ? BUN 21 (*)   ? All other components within normal limits  ?CBC - Abnormal; Notable for the following components:  ? MCV 77.0 (*)   ? All other components within normal limits  ?URINALYSIS, ROUTINE W REFLEX MICROSCOPIC - Abnormal; Notable for the following components:  ? Color, Urine STRAW (*)   ? Glucose, UA >=500 (*)   ? All other components within normal limits  ?CBG MONITORING, ED - Abnormal; Notable for the following components:  ? Glucose-Capillary 510 (*)   ? All other components within normal limits  ? ? ?EKG ?None ? ?Radiology ?No results found. ? ?Procedures ?Procedures  ? ? ?Medications Ordered in ED ?Medications  ?sodium chloride 0.9 % bolus 1,000 mL (1,000 mLs Intravenous New Bag/Given 03/11/22 1555)  ?insulin aspart (novoLOG) injection 5 Units (5 Units Subcutaneous Given 03/11/22 1551)  ? ? ?ED Course/ Medical Decision Making/ A&P ?  ?                        ?Medical Decision Making ?Risk ?Prescription drug management. ? ? ?Christopher Hays is here with high blood sugar.  Was seen by primary care doctor yesterday and told to come as his blood sugar was over 500.  Patient is relatively asymptomatic.  Has a history of high blood pressure, type 2 diabetes.  Had previously been on insulin, Victoza, metformin but has not been taking them  recently.  Was instructed to start Trulicity and metformin yesterday after seeing primary care doctor.  Was sent here to further rule out DKA or other diabetes emergency.  Vital signs are overall unremarkable.  Blood sugar is 510.  Will check CBC, BMP, urinalysis.  Will give IV fluid bolus and reevaluate.  Upon further discussion with family and the patient there are slightly concerned about the side effects of Trulicity.  He has not liked how metformin has made him feel in the past.  I was able to reach out to the diabetes coordinator to call the family and to discuss these medications and try to create a plan going forward for his diabetes. ? ?Lab work overall shows blood sugar in the 500s but he is not in DKA.  No ketones in the urine.  There is no significant anemia or electrolyte abnormality otherwise.  Urinalysis negative for infection.  Overall we will give NovoLog.  Patient handed off to oncoming ED staff with patient pending evaluation by diabetes coordinator and reevaluation. ? ?This chart was dictated using voice recognition software.  Despite best efforts to proofread,  errors can occur which can change the documentation meaning.  ? ? ? ? ? ? ? ?Final Clinical Impression(s) / ED Diagnoses ?Final diagnoses:  ?Hyperglycemia  ? ? ?Rx / DC Orders ?ED Discharge Orders   ? ? None  ? ?  ? ? ?  ?Lennice Sites, DO ?03/11/22 1605 ? ?

## 2022-03-11 NOTE — ED Triage Notes (Signed)
Pt with hx of DM2 sent by PCP after high A1C results at office yesterday. Pt does not check his blood sugar and has not taken his Metformin in months.  ?

## 2022-03-11 NOTE — ED Notes (Signed)
Patient verbalizes understanding of d/c instructions. Opportunities for questions and answers were provided. Pt d/c from ED and ambulated to lobby.  

## 2023-10-06 ENCOUNTER — Encounter (HOSPITAL_BASED_OUTPATIENT_CLINIC_OR_DEPARTMENT_OTHER): Payer: Self-pay | Admitting: *Deleted

## 2023-10-06 ENCOUNTER — Emergency Department (HOSPITAL_BASED_OUTPATIENT_CLINIC_OR_DEPARTMENT_OTHER)
Admission: EM | Admit: 2023-10-06 | Discharge: 2023-10-07 | Disposition: A | Discharge status: Home / Self Care | Payer: BC Managed Care – PPO | Attending: Emergency Medicine | Admitting: Emergency Medicine

## 2023-10-06 ENCOUNTER — Other Ambulatory Visit: Payer: Self-pay

## 2023-10-06 DIAGNOSIS — E1165 Type 2 diabetes mellitus with hyperglycemia: Secondary | ICD-10-CM | POA: Insufficient documentation

## 2023-10-06 DIAGNOSIS — Z7984 Long term (current) use of oral hypoglycemic drugs: Secondary | ICD-10-CM | POA: Insufficient documentation

## 2023-10-06 DIAGNOSIS — Z794 Long term (current) use of insulin: Secondary | ICD-10-CM | POA: Insufficient documentation

## 2023-10-06 DIAGNOSIS — R739 Hyperglycemia, unspecified: Secondary | ICD-10-CM

## 2023-10-06 LAB — CBC
HCT: 41.7 % (ref 39.0–52.0)
Hemoglobin: 14.6 g/dL (ref 13.0–17.0)
MCH: 26.4 pg (ref 26.0–34.0)
MCHC: 35 g/dL (ref 30.0–36.0)
MCV: 75.4 fL — ABNORMAL LOW (ref 80.0–100.0)
Platelets: 309 10*3/uL (ref 150–400)
RBC: 5.53 MIL/uL (ref 4.22–5.81)
RDW: 12.8 % (ref 11.5–15.5)
WBC: 5.6 10*3/uL (ref 4.0–10.5)
nRBC: 0 % (ref 0.0–0.2)

## 2023-10-06 LAB — URINALYSIS, ROUTINE W REFLEX MICROSCOPIC
Bacteria, UA: NONE SEEN
Bilirubin Urine: NEGATIVE
Glucose, UA: >1000 mg/dL — AB
Hgb urine dipstick: NEGATIVE
Ketones, ur: NEGATIVE mg/dL
Leukocytes,Ua: NEGATIVE
Nitrite: NEGATIVE
Specific Gravity, Urine: 1.025 (ref 1.005–1.030)
pH: 5.5 (ref 5.0–8.0)

## 2023-10-06 LAB — CBG MONITORING, ED
Glucose-Capillary: 507 mg/dL (ref 70–99)
Glucose-Capillary: 455 mg/dL — ABNORMAL HIGH (ref 70–99)

## 2023-10-06 LAB — BASIC METABOLIC PANEL
Anion gap: 9 (ref 5–15)
BUN: 15 mg/dL (ref 6–20)
CO2: 28 mmol/L (ref 22–32)
Calcium: 9.9 mg/dL (ref 8.9–10.3)
Chloride: 93 mmol/L — ABNORMAL LOW (ref 98–111)
Creatinine, Ser: 1.12 mg/dL (ref 0.61–1.24)
GFR, Estimated: >60 mL/min (ref >60)
Glucose, Bld: 565 mg/dL (ref 70–99)
Potassium: 3.9 mmol/L (ref 3.5–5.1)
Sodium: 130 mmol/L — ABNORMAL LOW (ref 135–145)

## 2023-10-06 MED ORDER — SODIUM CHLORIDE 0.9 % IV BOLUS
1000.0000 mL | Freq: Once | INTRAVENOUS | Status: AC
  Administered 2023-10-06: 1000 mL via INTRAVENOUS

## 2023-10-06 MED ORDER — INSULIN REGULAR HUMAN 100 UNIT/ML IJ SOLN: 10.0000 [IU] | Freq: Once | INTRAMUSCULAR | Status: DC

## 2023-10-06 MED ORDER — INSULIN ASPART 100 UNIT/ML IJ SOLN
10.0000 [IU] | Freq: Once | INTRAMUSCULAR | Status: AC
  Administered 2023-10-06: 10 [IU] via SUBCUTANEOUS

## 2023-10-06 NOTE — ED Provider Triage Note (Signed)
Emergency Medicine Provider Triage Evaluation Note  Christopher Hays , a 37 y.o. male  was evaluated in triage.  Pt complains of evaded glucose.  Patient went to primary care and was told his glucose was over 500.  Patient reports he has not been taking his insulin for over a year.  Patient reports he feels unwell.  Patient complains of weakness.  Patient complains of fatigue.  Review of Systems  Positive: Weakness Negative: Fever or chills  Physical Exam  BP (!) 160/122 (BP Location: Right Arm)   Pulse (!) 108   Temp 98.6 F (37 C)   Resp 16   SpO2 99%  Gen:   Awake, no distress   Resp:  Normal effort  MSK:   Moves extremities without difficulty  Other:    Medical Decision Making  Medically screening exam initiated at 8:20 PM.  Appropriate orders placed.  Navdeep Nichols was informed that the remainder of the evaluation will be completed by another provider, this initial triage assessment does not replace that evaluation, and the importance of remaining in the ED until their evaluation is complete.     Elson Areas, [place] 10/06/23 2021

## 2023-10-06 NOTE — ED Triage Notes (Signed)
Pt has been feeling unwell since yesterday.  [facility] and was told his CBG was elevated and he should come to ED.  Pt reports that he has not been checking his CBG at home and has not been taking any meds/insulin for diabetes for around a year.  Pt is alert and oriented.

## 2023-10-07 MED ORDER — TOUJEO SOLOSTAR 300 UNIT/ML ~~LOC~~ SOPN
25.0000 [IU] | PEN_INJECTOR | Freq: Every day | SUBCUTANEOUS | 2 refills | Status: AC
Start: 2023-10-07 — End: ?

## 2023-10-07 MED ORDER — TRULICITY 1.5 MG/0.5ML ~~LOC~~ SOAJ: 1.5000 mg | SUBCUTANEOUS | 2 refills | Status: AC

## 2023-10-07 MED ORDER — LANCETS MISC. MISC: 1.0000 | Freq: Three times a day (TID) | 0 refills | Status: AC

## 2023-10-07 MED ORDER — BLOOD GLUCOSE TEST VI STRP: 1.0000 | ORAL_STRIP | Freq: Three times a day (TID) | 0 refills | Status: AC

## 2023-10-07 MED ORDER — LANCET DEVICE MISC: 1.0000 | Freq: Three times a day (TID) | 0 refills | Status: AC

## 2023-10-07 MED ORDER — BLOOD GLUCOSE MONITORING SUPPL DEVI: 1.0000 | Freq: Three times a day (TID) | 0 refills | Status: AC

## 2023-10-07 NOTE — ED Provider Notes (Signed)
[place] EMERGENCY DEPARTMENT AT [facility]  Provider Note  CSN: [id number] Arrival date & time: 10/06/23 1936  History Chief Complaint  Patient presents with   Hyperglycemia    Christopher Hays is a 37 y.o. male with history of Type 2 DM not compliant with medications, has not taken anything for over a year, was previously on Trulicity and Toujeo, did not tolerate metformin. Reports he has had increased urination but otherwise feeling well. Seen by new PCP and sent to the ED for hyperglycemia. Denies any other acute complaints. Given IVF and insulin in triage.    Home Medications Prior to Admission medications   Medication Sig Start Date End Date Taking? Authorizing Provider  Blood Glucose Monitoring Suppl DEVI 1 each by Does not apply route in the morning, at noon, and at bedtime. May substitute to any manufacturer covered by patient's insurance. 10/07/23  Yes Pollyann Savoy, MD  Dulaglutide (TRULICITY) 1.5 MG/0.5ML SOAJ Inject 1.5 mg into the skin once a week. 10/07/23  Yes Pollyann Savoy, MD  Glucose Blood (BLOOD GLUCOSE TEST STRIPS) STRP 1 each by In Vitro route in the morning, at noon, and at bedtime. May substitute to any manufacturer covered by patient's insurance. 10/07/23 11/06/23 Yes Pollyann Savoy, MD  Lancet Device MISC 1 each by Does not apply route in the morning, at noon, and at bedtime. May substitute to any manufacturer covered by patient's insurance. 10/07/23 11/06/23 Yes Pollyann Savoy, MD  Lancets Misc. MISC 1 each by Does not apply route in the morning, at noon, and at bedtime. May substitute to any manufacturer covered by patient's insurance. 10/07/23 11/06/23 Yes Pollyann Savoy, MD  allopurinol (ZYLOPRIM) 300 MG tablet Take 300 mg by mouth 2 (two) times daily.  06/29/18   [provider]  amLODipine (NORVASC) 10 MG tablet Take 10 mg by mouth daily. 07/23/20   [provider]  blood glucose meter kit and supplies Dispense  based on patient and insurance preference. Use up to four times daily as directed. (FOR ICD-10 E10.9, E11.9). 08/03/19   Mikhail, Nita Sells, DO  cetirizine (ZYRTEC) 10 MG tablet Take 10 mg by mouth daily as needed for allergies.  09/14/20   [provider]  Cyanocobalamin (VITAMIN B-12) 2500 MCG SUBL Place 1 tablet under the tongue daily.    [provider]  Insulin Pen Needle 31G X 5 MM MISC Use with insulin pen 08/03/19   Edsel Petrin, DO  metFORMIN (GLUCOPHAGE) 1000 MG tablet Take 1,000 mg by mouth 2 (two) times daily. 09/16/20   [provider]  pantoprazole (PROTONIX) 40 MG tablet Take 40 mg by mouth daily. 07/23/20   [provider]  rosuvastatin (CRESTOR) 20 MG tablet Take 20 mg by mouth at bedtime. 07/23/20   [provider]  telmisartan (MICARDIS) 80 MG tablet Take 80 mg by mouth daily. 07/30/20   [provider]  TOUJEO SOLOSTAR 300 UNIT/ML Solostar Pen Inject 25 Units into the skin at bedtime. 10/07/23   Pollyann Savoy, MD     Allergies    Patient has no known allergies.   Review of Systems   Review of Systems Please see HPI for pertinent positives and negatives  Physical Exam BP (!) 160/122 (BP Location: Right Arm)   Pulse (!) 108   Temp 98.6 F (37 C)   Resp 16   SpO2 99%   Physical Exam Vitals and nursing note reviewed.  HENT:     Head: Normocephalic.  Nose: Nose normal.  Eyes:     Extraocular Movements: Extraocular movements intact.  Pulmonary:     Effort: Pulmonary effort is normal.  Musculoskeletal:        General: Normal range of motion.     Cervical back: Neck supple.  Skin:    Findings: No rash (on exposed skin).  Neurological:     Mental Status: He is alert and oriented to person, place, and time.  Psychiatric:        Mood and Affect: Mood normal.     ED Results / Procedures / Treatments   EKG None  Procedures Procedures  Medications Ordered in the ED Medications  sodium chloride 0.9  % bolus 1,000 mL (0 mLs Intravenous Stopped 10/06/23 2155)  insulin aspart (novoLOG) injection 10 Units (10 Units Subcutaneous Given 10/06/23 2046)  sodium chloride 0.9 % bolus 1,000 mL (0 mLs Intravenous Stopped 10/07/23 0000)    Initial Impression and Plan  Patient with known DM and medication noncompliance here with hyperglycemia. Labs done in triage show hyperglycemia without DKA. CBC is unremarkable. CBG has improved some but still elevated although this is chronic for him. Will refill his medications, glucometer and test strips. I stressed the importance of long term glucose control to avoid complications. Reviewed dietary restrictions which he is familiar with. PCP follow up, RTED for any other concerns.    ED Course       MDM Rules/Calculators/A&P Medical Decision Making Given presenting complaint, I considered that admission might be necessary. After review of results from ED lab and/or imaging studies, admission to the hospital is not indicated at this time.    Problems Addressed: Hyperglycemia: chronic illness or injury with exacerbation, progression, or side effects of treatment  Amount and/or Complexity of Data Reviewed Labs: ordered. Decision-making details documented in ED Course.  Risk OTC drugs. Prescription drug management. Decision regarding hospitalization.     Final Clinical Impression(s) / ED Diagnoses Final diagnoses:  Hyperglycemia    Rx / DC Orders ED Discharge Orders          Ordered    TOUJEO SOLOSTAR 300 UNIT/ML Solostar Pen  Daily at bedtime        10/07/23 0026    Dulaglutide (TRULICITY) 1.5 MG/0.5ML SOAJ  Weekly        10/07/23 0026    Blood Glucose Monitoring Suppl DEVI  3 times daily        10/07/23 0026    Glucose Blood (BLOOD GLUCOSE TEST STRIPS) STRP  3 times daily        10/07/23 0026    Lancet Device MISC  3 times daily        10/07/23 0026    Lancets Misc. MISC  3 times daily        10/07/23 0026             Pollyann Savoy, MD 10/07/23 [phone]

## 2023-11-22 ENCOUNTER — Other Ambulatory Visit (HOSPITAL_BASED_OUTPATIENT_CLINIC_OR_DEPARTMENT_OTHER): Payer: Self-pay

## 2023-11-22 DIAGNOSIS — G4730 Sleep apnea, unspecified: Secondary | ICD-10-CM

## 2024-02-23 ENCOUNTER — Emergency Department (HOSPITAL_BASED_OUTPATIENT_CLINIC_OR_DEPARTMENT_OTHER)
Admission: EM | Admit: 2024-02-23 | Discharge: 2024-02-23 | Disposition: A | Discharge status: Home / Self Care | Attending: Emergency Medicine | Admitting: Emergency Medicine

## 2024-02-23 ENCOUNTER — Other Ambulatory Visit: Payer: Self-pay

## 2024-02-23 ENCOUNTER — Encounter (HOSPITAL_BASED_OUTPATIENT_CLINIC_OR_DEPARTMENT_OTHER): Payer: Self-pay | Admitting: *Deleted

## 2024-02-23 DIAGNOSIS — R519 Headache, unspecified: Secondary | ICD-10-CM | POA: Insufficient documentation

## 2024-02-23 DIAGNOSIS — I10 Essential (primary) hypertension: Secondary | ICD-10-CM | POA: Diagnosis not present

## 2024-02-23 DIAGNOSIS — Z7984 Long term (current) use of oral hypoglycemic drugs: Secondary | ICD-10-CM | POA: Insufficient documentation

## 2024-02-23 DIAGNOSIS — Z794 Long term (current) use of insulin: Secondary | ICD-10-CM | POA: Diagnosis not present

## 2024-02-23 DIAGNOSIS — E119 Type 2 diabetes mellitus without complications: Secondary | ICD-10-CM | POA: Diagnosis not present

## 2024-02-23 DIAGNOSIS — Z79899 Other long term (current) drug therapy: Secondary | ICD-10-CM | POA: Insufficient documentation

## 2024-02-23 HISTORY — DX: Sleep apnea, unspecified: G47.30

## 2024-02-23 HISTORY — DX: Type 2 diabetes mellitus without complications: E11.9

## 2024-02-23 LAB — CBG MONITORING, ED: Glucose-Capillary: 129 mg/dL — ABNORMAL HIGH (ref 70–99)

## 2024-02-23 MED ORDER — PROCHLORPERAZINE MALEATE 10 MG PO TABS: 10.0000 mg | ORAL_TABLET | Freq: Three times a day (TID) | ORAL | 0 refills | Status: DC | PRN

## 2024-02-23 MED ORDER — KETOROLAC TROMETHAMINE 15 MG/ML IJ SOLN
15.0000 mg | Freq: Once | INTRAMUSCULAR | Status: AC
  Administered 2024-02-23: 15 mg via INTRAVENOUS
  Filled 2024-02-23: qty 1

## 2024-02-23 MED ORDER — PROCHLORPERAZINE EDISYLATE 10 MG/2ML IJ SOLN
10.0000 mg | Freq: Once | INTRAMUSCULAR | Status: AC
  Administered 2024-02-23: 10 mg via INTRAVENOUS
  Filled 2024-02-23: qty 2

## 2024-02-23 MED ORDER — ACETAMINOPHEN 325 MG PO TABS
650.0000 mg | ORAL_TABLET | Freq: Once | ORAL | Status: AC
  Administered 2024-02-23: 650 mg via ORAL
  Filled 2024-02-23: qty 2

## 2024-02-23 MED ORDER — SODIUM CHLORIDE 0.9 % IV BOLUS
1000.0000 mL | Freq: Once | INTRAVENOUS | Status: AC
Start: 2024-02-23 — End: 2024-02-23
  Administered 2024-02-23: 1000 mL via INTRAVENOUS

## 2024-02-23 MED ORDER — DIPHENHYDRAMINE HCL 50 MG/ML IJ SOLN
50.0000 mg | Freq: Once | INTRAMUSCULAR | Status: AC
  Administered 2024-02-23: 50 mg via INTRAVENOUS
  Filled 2024-02-23: qty 1

## 2024-02-23 NOTE — Discharge Instructions (Signed)
 Your history, exam, and evaluation are consistent with a bad headache that could be migrainous given the light and sound sensitivity you are experiencing.  Your exam was reassuring and we agreed to go to hold on imaging at this time.  After the medicines your headache resolved you are safe.  Please consider taking the medication prescription to help with recurrent headache or nausea and please rest and stay hydrated.  Please follow-up with a primary doctor to get your CPAP at home sorted out for your sleep apnea and we recommend following up with neurology as well if this persist.  If any symptoms change or worsen acutely, return to the nearest emergency department.

## 2024-02-23 NOTE — ED Provider Notes (Signed)
 [place] EMERGENCY DEPARTMENT AT [facility] Provider Note   CSN: [id number] Arrival date & time: 02/23/24  1617     History  Chief Complaint  Patient presents with   Headache    Christopher Hays is a 38 y.o. male.  The history is provided by the patient and medical records. No language interpreter was used.  Headache Pain location:  R temporal and L temporal Quality:  Dull Radiates to:  Does not radiate Severity currently:  7/10 Onset quality:  Gradual Duration:  1 day Timing:  Sporadic Progression:  Unchanged Chronicity:  Recurrent Similar to prior headaches: yes   Context: bright light and loud noise   Relieved by:  Nothing Worsened by:  Light and sound Ineffective treatments:  None tried Associated symptoms: nausea and photophobia   Associated symptoms: no abdominal pain, no back pain, no blurred vision, no congestion, no cough, no diarrhea, no dizziness, no ear pain, no eye pain, no facial pain, no fatigue, no fever, no focal weakness, no loss of balance, no near-syncope, no neck pain, no neck stiffness, no numbness, no paresthesias, no syncope, no URI, no visual change, no vomiting and no weakness        Home Medications Prior to Admission medications   Medication Sig Start Date End Date Taking? Authorizing Provider  albuterol (VENTOLIN HFA) 108 (90 Base) MCG/ACT inhaler Inhale 2 puffs into the lungs every 6 (six) hours as needed for shortness of breath. 02/09/24 03/10/24 Yes [provider]  allopurinol (ZYLOPRIM) 300 MG tablet Take 300 mg by mouth 2 (two) times daily.  06/29/18   [provider]  amLODipine (NORVASC) 10 MG tablet Take 10 mg by mouth daily. 07/23/20   [provider]  blood glucose meter kit and supplies Dispense based on patient and insurance preference. Use up to four times daily as directed. (FOR ICD-10 E10.9, E11.9). 08/03/19   Edsel Petrin, DO  Blood Glucose Monitoring Suppl DEVI 1 each by Does not apply route  in the morning, at noon, and at bedtime. May substitute to any manufacturer covered by patient's insurance. 10/07/23   Pollyann Savoy, MD  cetirizine (ZYRTEC) 10 MG tablet Take 10 mg by mouth daily as needed for allergies.  09/14/20   [provider]  Cyanocobalamin (VITAMIN B-12) 2500 MCG SUBL Place 1 tablet under the tongue daily.    [provider]  Dulaglutide (TRULICITY) 1.5 MG/0.5ML SOAJ Inject 1.5 mg into the skin once a week. 10/07/23   Pollyann Savoy, MD  Insulin Pen Needle 31G X 5 MM MISC Use with insulin pen 08/03/19   Edsel Petrin, DO  metFORMIN (GLUCOPHAGE) 1000 MG tablet Take 1,000 mg by mouth 2 (two) times daily. 09/16/20   [provider]  pantoprazole (PROTONIX) 40 MG tablet Take 40 mg by mouth daily. 07/23/20   [provider]  rosuvastatin (CRESTOR) 20 MG tablet Take 20 mg by mouth at bedtime. 07/23/20   [provider]  telmisartan (MICARDIS) 80 MG tablet Take 80 mg by mouth daily. 07/30/20   [provider]  TOUJEO SOLOSTAR 300 UNIT/ML Solostar Pen Inject 25 Units into the skin at bedtime. 10/07/23   Pollyann Savoy, MD      Allergies    Patient has no known allergies.    Review of Systems   Review of Systems  Constitutional:  Negative for chills, fatigue and fever.  HENT:  Negative for congestion and ear pain.   Eyes:  Positive for photophobia. Negative for  blurred vision and pain.  Respiratory:  Negative for cough, chest tightness, shortness of breath, wheezing and stridor.   Cardiovascular:  Negative for chest pain, palpitations, syncope and near-syncope.  Gastrointestinal:  Positive for nausea. Negative for abdominal pain, constipation, diarrhea and vomiting.  Genitourinary:  Negative for flank pain.  Musculoskeletal:  Negative for back pain, neck pain and neck stiffness.  Skin:  Negative for rash and wound.  Neurological:  Positive for light-headedness and headaches. Negative for dizziness, focal  weakness, syncope, facial asymmetry, weakness, numbness, paresthesias and loss of balance.  Psychiatric/Behavioral:  Negative for agitation and confusion.   All other systems reviewed and are negative.   Physical Exam Updated Vital Signs BP (!) 148/119 (BP Location: Right Arm)   Pulse (!) 106   Temp 97.9 F (36.6 C)   Resp 18   SpO2 97%  Physical Exam Vitals and nursing note reviewed.  Constitutional:      General: He is not in acute distress.    Appearance: He is well-developed. He is not ill-appearing, toxic-appearing or diaphoretic.  HENT:     Head: Normocephalic and atraumatic.     Nose: No congestion or rhinorrhea.     Mouth/Throat:     Mouth: Mucous membranes are dry.  Eyes:     Extraocular Movements: Extraocular movements intact.     Conjunctiva/sclera: Conjunctivae normal.     Pupils: Pupils are equal, round, and reactive to light.  Cardiovascular:     Rate and Rhythm: Normal rate and regular rhythm.     Heart sounds: No murmur heard. Pulmonary:     Effort: Pulmonary effort is normal. No respiratory distress.     Breath sounds: Normal breath sounds. No wheezing, rhonchi or rales.  Chest:     Chest wall: No tenderness.  Abdominal:     Palpations: Abdomen is soft.     Tenderness: There is no abdominal tenderness. There is no right CVA tenderness, left CVA tenderness, guarding or rebound.  Musculoskeletal:        General: No swelling or tenderness.     Cervical back: Neck supple. No tenderness.  Skin:    General: Skin is warm and dry.     Capillary Refill: Capillary refill takes less than 2 seconds.     Findings: No erythema or rash.  Neurological:     General: No focal deficit present.     Mental Status: He is alert.     Sensory: No sensory deficit.     Motor: No weakness.  Psychiatric:        Mood and Affect: Mood normal.     ED Results / Procedures / Treatments   Labs (all labs ordered are listed, but only abnormal results are displayed) Labs Reviewed   CBG MONITORING, ED - Abnormal; Notable for the following components:      Result Value   Glucose-Capillary 129 (*)    All other components within normal limits    EKG None  Radiology No results found.  Procedures Procedures    Medications Ordered in ED Medications  sodium chloride 0.9 % bolus 1,000 mL (0 mLs Intravenous Stopped 02/23/24 1904)  prochlorperazine (COMPAZINE) injection 10 mg (10 mg Intravenous Given 02/23/24 1748)  diphenhydrAMINE (BENADRYL) injection 50 mg (50 mg Intravenous Given 02/23/24 1749)  acetaminophen (TYLENOL) tablet 650 mg (650 mg Oral Given 02/23/24 1753)  ketorolac (TORADOL) 15 MG/ML injection 15 mg (15 mg Intravenous Given 02/23/24 1749)    ED Course/ Medical Decision Making/ A&P  Medical Decision Making Risk OTC drugs. Prescription drug management.    Christopher Hays is a 38 y.o. male with a past medical history significant for sleep apnea, hypertension, diabetes, and gout who presents with headache.  According to patient, about 2 weeks ago he had several days of headache that went away.  He reports this morning he woke up and was having headache again.  He describes it as moderate to severe and has had some nausea.  Denies any vision changes or speech changes.  Denies any numbness, tingling, or weakness.  He was lightheaded but did not have any true room spinning dizziness.  No other neurologic complaints reported.  He denies any trauma.  Reports is in his temples bilaterally.  He reports a strong family history of migraines and he has had some headaches over the years but this feels worse.  He reports no other symptoms such as fevers, chills, neck pain, neck stiffness, chest pain, shortness of breath, constipation, diarrhea, or urinary changes.  No sick contacts reported.  He has not taken medicine to help with it.  He said that he has not used his CPAP at [place] months and is not knowing if this made a difference.  On exam,  lungs clear.  Chest nontender.  Abdomen nontender.  No focal neurologic deficits with intact sensation, strength, and pulse in extremities.  Normal finger-nose-finger testing.  Symmetric smile.  Clear speech.  Patient does have dry mucous membranes.  Patient otherwise well-appearing.  Given his reassuring exam and well appearance and strong history of headaches, we agreed to give a headache cocktail and reassess.  As he had no head trauma and exam was reassuring, we agreed to hold on head CT at this time.  If patient is feeling better, dissipate discharge for outpatient PCP and/or neurology follow-up with attrition for Compazine but if symptoms worsen or do not improve, would consider CT of the head.  Patient and family agree, dissipate reassessment after headache cocktail.     8:08 PM On reassessment headache is gone he was feeling much better.  He would like to go home and we will give him numbers for PCP and outpatient neurology follow-up.  Patient understood return precautions and follow-up instructions and was discharged in good condition with resolved symptoms.          Final Clinical Impression(s) / ED Diagnoses Final diagnoses:  Bad headache    Rx / DC Orders ED Discharge Orders          Ordered    prochlorperazine (COMPAZINE) 10 MG tablet  Every 8 hours PRN        02/23/24 2009            Clinical Impression: 1. Bad headache     Disposition: Discharge  Condition: Good  I have discussed the results, Dx and Tx plan with the pt(& family if present). He/she/they expressed understanding and agree(s) with the plan. Discharge instructions discussed at great length. Strict return precautions discussed and pt &/or family have verbalized understanding of the instructions. No further questions at time of discharge.    New Prescriptions   PROCHLORPERAZINE (COMPAZINE) 10 MG TABLET    Take 1 tablet (10 mg total) by mouth every 8 (eight) hours as needed for nausea or  vomiting.    Follow Up: [facility] [address] [place] [address] [phone]     [facility] [address] [place] [address] [place] [address]  [phone] Schedule an appointment as soon as possible for a visit    [facility] [address]     [address] [place] [address] [phone]    [facility] [address] [place] [address] [place] [address] [phone] Schedule an appointment as soon as possible for a visit        Daoud Lobue, Canary Brim, MD 02/23/24 2010

## 2024-02-23 NOTE — ED Triage Notes (Signed)
 Pt is here for further evaluation of headache which is felt in temples and is describes as throbbing and has been associated with sensitivity to light as well as dizziness and lightheadedness, he states that he woke up at 5am and felt the both the headache and dizziness at that time.  Pt went to bed at 11pm last pm and was feeling fine at the time.  No meds pta.  No focal weakness or numbness with this.

## 2024-02-23 NOTE — ED Notes (Signed)
States headache is gone

## 2024-04-19 ENCOUNTER — Encounter (HOSPITAL_BASED_OUTPATIENT_CLINIC_OR_DEPARTMENT_OTHER): Payer: Self-pay

## 2024-04-19 ENCOUNTER — Other Ambulatory Visit: Payer: Self-pay

## 2024-04-19 ENCOUNTER — Inpatient Hospital Stay (HOSPITAL_BASED_OUTPATIENT_CLINIC_OR_DEPARTMENT_OTHER)
Admission: EM | Admit: 2024-04-19 | Discharge: 2024-04-23 | DRG: 286 | Disposition: A | Discharge status: Home / Self Care | Attending: Internal Medicine | Admitting: Internal Medicine

## 2024-04-19 ENCOUNTER — Emergency Department (HOSPITAL_BASED_OUTPATIENT_CLINIC_OR_DEPARTMENT_OTHER): Admitting: Radiology

## 2024-04-19 DIAGNOSIS — Z5986 Financial insecurity: Secondary | ICD-10-CM

## 2024-04-19 DIAGNOSIS — I10 Essential (primary) hypertension: Secondary | ICD-10-CM | POA: Diagnosis not present

## 2024-04-19 DIAGNOSIS — M109 Gout, unspecified: Secondary | ICD-10-CM | POA: Diagnosis present

## 2024-04-19 DIAGNOSIS — Z794 Long term (current) use of insulin: Secondary | ICD-10-CM

## 2024-04-19 DIAGNOSIS — E1165 Type 2 diabetes mellitus with hyperglycemia: Secondary | ICD-10-CM | POA: Diagnosis present

## 2024-04-19 DIAGNOSIS — E1129 Type 2 diabetes mellitus with other diabetic kidney complication: Secondary | ICD-10-CM

## 2024-04-19 DIAGNOSIS — E66813 Obesity, class 3: Secondary | ICD-10-CM | POA: Diagnosis present

## 2024-04-19 DIAGNOSIS — I110 Hypertensive heart disease with heart failure: Principal | ICD-10-CM | POA: Diagnosis present

## 2024-04-19 DIAGNOSIS — E782 Mixed hyperlipidemia: Secondary | ICD-10-CM | POA: Diagnosis present

## 2024-04-19 DIAGNOSIS — R809 Proteinuria, unspecified: Secondary | ICD-10-CM

## 2024-04-19 DIAGNOSIS — I3139 Other pericardial effusion (noninflammatory): Secondary | ICD-10-CM | POA: Diagnosis present

## 2024-04-19 DIAGNOSIS — R0602 Shortness of breath: Secondary | ICD-10-CM | POA: Diagnosis not present

## 2024-04-19 DIAGNOSIS — Z79899 Other long term (current) drug therapy: Secondary | ICD-10-CM

## 2024-04-19 DIAGNOSIS — Z7984 Long term (current) use of oral hypoglycemic drugs: Secondary | ICD-10-CM

## 2024-04-19 DIAGNOSIS — Z6839 Body mass index (BMI) 39.0-39.9, adult: Secondary | ICD-10-CM

## 2024-04-19 DIAGNOSIS — I509 Heart failure, unspecified: Secondary | ICD-10-CM

## 2024-04-19 DIAGNOSIS — Z91199 Patient's noncompliance with other medical treatment and regimen due to unspecified reason: Secondary | ICD-10-CM

## 2024-04-19 DIAGNOSIS — R7989 Other specified abnormal findings of blood chemistry: Secondary | ICD-10-CM | POA: Insufficient documentation

## 2024-04-19 DIAGNOSIS — G4733 Obstructive sleep apnea (adult) (pediatric): Secondary | ICD-10-CM | POA: Diagnosis present

## 2024-04-19 DIAGNOSIS — I428 Other cardiomyopathies: Secondary | ICD-10-CM | POA: Diagnosis present

## 2024-04-19 DIAGNOSIS — I5021 Acute systolic (congestive) heart failure: Secondary | ICD-10-CM | POA: Diagnosis present

## 2024-04-19 DIAGNOSIS — D509 Iron deficiency anemia, unspecified: Secondary | ICD-10-CM | POA: Diagnosis present

## 2024-04-19 DIAGNOSIS — I272 Pulmonary hypertension, unspecified: Secondary | ICD-10-CM | POA: Diagnosis present

## 2024-04-19 DIAGNOSIS — Z560 Unemployment, unspecified: Secondary | ICD-10-CM

## 2024-04-19 DIAGNOSIS — Z7985 Long-term (current) use of injectable non-insulin antidiabetic drugs: Secondary | ICD-10-CM

## 2024-04-19 DIAGNOSIS — I2510 Atherosclerotic heart disease of native coronary artery without angina pectoris: Secondary | ICD-10-CM | POA: Diagnosis present

## 2024-04-19 LAB — TSH: TSH: 1.316 u[IU]/mL (ref 0.350–4.500)

## 2024-04-19 LAB — CBC
HCT: 37.5 % — ABNORMAL LOW (ref 39.0–52.0)
Hemoglobin: 12.6 g/dL — ABNORMAL LOW (ref 13.0–17.0)
MCH: 25.4 pg — ABNORMAL LOW (ref 26.0–34.0)
MCHC: 33.6 g/dL (ref 30.0–36.0)
MCV: 75.6 fL — ABNORMAL LOW (ref 80.0–100.0)
Platelets: 366 10*3/uL (ref 150–400)
RBC: 4.96 MIL/uL (ref 4.22–5.81)
RDW: 13.6 % (ref 11.5–15.5)
WBC: 4.7 10*3/uL (ref 4.0–10.5)
nRBC: 0 % (ref 0.0–0.2)

## 2024-04-19 LAB — VITAMIN B12: Vitamin B-12: 1208 pg/mL — ABNORMAL HIGH (ref 180–914)

## 2024-04-19 LAB — BASIC METABOLIC PANEL WITH GFR
Anion gap: 12 (ref 5–15)
BUN: 13 mg/dL (ref 6–20)
CO2: 24 mmol/L (ref 22–32)
Calcium: 9.1 mg/dL (ref 8.9–10.3)
Chloride: 107 mmol/L (ref 98–111)
Creatinine, Ser: 0.84 mg/dL (ref 0.61–1.24)
GFR, Estimated: >60 mL/min (ref >60)
Glucose, Bld: 136 mg/dL — ABNORMAL HIGH (ref 70–99)
Potassium: 3.7 mmol/L (ref 3.5–5.1)
Sodium: 143 mmol/L (ref 135–145)

## 2024-04-19 LAB — CBC WITH DIFFERENTIAL/PLATELET
Abs Immature Granulocytes: 0.01 10*3/uL (ref 0.00–0.07)
Basophils Absolute: 0 10*3/uL (ref 0.0–0.1)
Basophils Relative: 0 %
Eosinophils Absolute: 0.1 10*3/uL (ref 0.0–0.5)
Eosinophils Relative: 2 %
HCT: 35.8 % — ABNORMAL LOW (ref 39.0–52.0)
Hemoglobin: 12.3 g/dL — ABNORMAL LOW (ref 13.0–17.0)
Immature Granulocytes: 0 %
Lymphocytes Relative: 19 %
Lymphs Abs: 0.9 10*3/uL (ref 0.7–4.0)
MCH: 25.8 pg — ABNORMAL LOW (ref 26.0–34.0)
MCHC: 34.4 g/dL (ref 30.0–36.0)
MCV: 75.2 fL — ABNORMAL LOW (ref 80.0–100.0)
Monocytes Absolute: 0.6 10*3/uL (ref 0.1–1.0)
Monocytes Relative: 13 %
Neutro Abs: 3.1 10*3/uL (ref 1.7–7.7)
Neutrophils Relative %: 66 %
Platelets: 331 10*3/uL (ref 150–400)
RBC: 4.76 MIL/uL (ref 4.22–5.81)
RDW: 13.6 % (ref 11.5–15.5)
WBC: 4.8 10*3/uL (ref 4.0–10.5)
nRBC: 0 % (ref 0.0–0.2)

## 2024-04-19 LAB — GLUCOSE, CAPILLARY: Glucose-Capillary: 129 mg/dL — ABNORMAL HIGH (ref 70–99)

## 2024-04-19 LAB — CREATININE, SERUM
Creatinine, Ser: 0.82 mg/dL (ref 0.61–1.24)
GFR, Estimated: >60 mL/min (ref >60)

## 2024-04-19 LAB — IRON AND TIBC
Iron: 26 ug/dL — ABNORMAL LOW (ref 45–182)
Saturation Ratios: 9 % — ABNORMAL LOW (ref 17.9–39.5)
TIBC: 302 ug/dL (ref 250–450)
UIBC: 276 ug/dL

## 2024-04-19 LAB — PRO BRAIN NATRIURETIC PEPTIDE: Pro Brain Natriuretic Peptide: 1470 pg/mL — ABNORMAL HIGH (ref <300.0)

## 2024-04-19 LAB — RETICULOCYTES
Immature Retic Fract: 8.7 % (ref 2.3–15.9)
RBC.: 4.97 MIL/uL (ref 4.22–5.81)
Retic Count, Absolute: 36.8 10*3/uL (ref 19.0–186.0)
Retic Ct Pct: 0.7 % (ref 0.4–3.1)

## 2024-04-19 LAB — FERRITIN: Ferritin: 328 ng/mL (ref 24–336)

## 2024-04-19 LAB — HEMOGLOBIN A1C
Hgb A1c MFr Bld: 7.6 % — ABNORMAL HIGH (ref 4.8–5.6)
Mean Plasma Glucose: 171.42 mg/dL

## 2024-04-19 LAB — MAGNESIUM: Magnesium: 1.4 mg/dL — ABNORMAL LOW (ref 1.7–2.4)

## 2024-04-19 LAB — TROPONIN T, HIGH SENSITIVITY
Troponin T High Sensitivity: 70 ng/L — ABNORMAL HIGH (ref <19)
Troponin T High Sensitivity: 70 ng/L — ABNORMAL HIGH (ref <19)

## 2024-04-19 LAB — FOLATE: Folate: 9.5 ng/mL (ref >5.9)

## 2024-04-19 MED ORDER — ALLOPURINOL 300 MG PO TABS
300.0000 mg | ORAL_TABLET | Freq: Every day | ORAL | Status: DC
  Administered 2024-04-20 – 2024-04-23 (×4): 300 mg via ORAL
  Filled 2024-04-19 (×4): qty 1

## 2024-04-19 MED ORDER — ACETAMINOPHEN 325 MG PO TABS
650.0000 mg | ORAL_TABLET | Freq: Four times a day (QID) | ORAL | Status: DC | PRN
  Administered 2024-04-21: 650 mg via ORAL
  Filled 2024-04-19: qty 2

## 2024-04-19 MED ORDER — INSULIN ASPART 100 UNIT/ML IJ SOLN
0.0000 [IU] | Freq: Three times a day (TID) | INTRAMUSCULAR | Status: DC
  Administered 2024-04-20 – 2024-04-21 (×3): 1 [IU] via SUBCUTANEOUS
  Administered 2024-04-22: 2 [IU] via SUBCUTANEOUS
  Administered 2024-04-22: 3 [IU] via SUBCUTANEOUS
  Administered 2024-04-23: 5 [IU] via SUBCUTANEOUS

## 2024-04-19 MED ORDER — FUROSEMIDE 10 MG/ML IJ SOLN
40.0000 mg | Freq: Once | INTRAMUSCULAR | Status: AC
  Administered 2024-04-19: 40 mg via INTRAVENOUS
  Filled 2024-04-19: qty 4

## 2024-04-19 MED ORDER — ACETAMINOPHEN 650 MG RE SUPP: 650.0000 mg | Freq: Four times a day (QID) | RECTAL | Status: DC | PRN

## 2024-04-19 MED ORDER — INSULIN GLARGINE-YFGN 100 UNIT/ML ~~LOC~~ SOLN
26.0000 [IU] | Freq: Every day | SUBCUTANEOUS | Status: DC
  Administered 2024-04-19 – 2024-04-22 (×4): 26 [IU] via SUBCUTANEOUS
  Filled 2024-04-19 (×5): qty 0.26

## 2024-04-19 MED ORDER — ENOXAPARIN SODIUM 60 MG/0.6ML IJ SOSY
60.0000 mg | PREFILLED_SYRINGE | INTRAMUSCULAR | Status: DC
  Administered 2024-04-20 – 2024-04-23 (×4): 60 mg via SUBCUTANEOUS
  Filled 2024-04-19 (×4): qty 0.6

## 2024-04-19 MED ORDER — FUROSEMIDE 10 MG/ML IJ SOLN
40.0000 mg | Freq: Two times a day (BID) | INTRAMUSCULAR | Status: AC
  Administered 2024-04-19 – 2024-04-20 (×2): 40 mg via INTRAVENOUS
  Filled 2024-04-19 (×2): qty 4

## 2024-04-19 MED ORDER — AMLODIPINE BESYLATE 10 MG PO TABS
10.0000 mg | ORAL_TABLET | Freq: Every day | ORAL | Status: DC
  Administered 2024-04-20: 10 mg via ORAL
  Filled 2024-04-19: qty 1

## 2024-04-19 MED ORDER — POTASSIUM CHLORIDE 20 MEQ PO PACK
40.0000 meq | PACK | Freq: Once | ORAL | Status: AC
  Administered 2024-04-19: 40 meq via ORAL
  Filled 2024-04-19: qty 2

## 2024-04-19 MED ORDER — ROSUVASTATIN CALCIUM 20 MG PO TABS
20.0000 mg | ORAL_TABLET | Freq: Every day | ORAL | Status: DC
Start: 2024-04-19 — End: 2024-04-23
  Administered 2024-04-19 – 2024-04-22 (×4): 20 mg via ORAL
  Filled 2024-04-19 (×4): qty 1

## 2024-04-19 NOTE — H&P (Signed)
 History and Physical    Draydon Kano ZOX:[id number] DOB: 07-26-1986 DOA: 04/19/2024    Patient coming from: Home.  Chief Complaint: Shortness of breath.  HPI: Christopher Hays is a 38 y.o. male with history of diabetes mellitus type 2, hypertension, hyperlipidemia, sleep apnea, gout presents to the ER with complaints of increasing lower extremity edema and shortness of breath.  About a month ago patient has been having nonproductive cough and had gone to urgent care and was treated for bronchitis with steroids for 5 days.  And patient states over the last 2 months patient has been experiencing shortness of breath orthopnea and increasing lower EXTR edema.  Denies any chest pain fever or chills.  Has been compliant with his medications.  ED Course: In the ER chest x-ray shows trace bilateral pleural effusion.  proBNP was 1400 and troponins are flat at 70.  EKG shows sinus tachycardia.  Patient was given 40 mg IV Lasix and admitted for acute CHF new onset.  On exam patient has bilateral lower extremity edema.  Review of Systems: As per HPI, rest all negative.   Past Medical History:  Diagnosis Date   Diabetes mellitus without complication (HCC)    Gout    Hypertension    Sleep apnea     History reviewed. No pertinent surgical history.   reports that he has never smoked. He has never used smokeless tobacco. He reports current alcohol use. He reports that he does not use drugs.  No Known Allergies  Family History  Problem Relation Age of Onset   Gout Paternal Grandmother    Gout Paternal Grandfather    Diabetes Neg Hx     Prior to Admission medications   Medication Sig Start Date End Date Taking? Authorizing Provider  albuterol (VENTOLIN HFA) 108 (90 Base) MCG/ACT inhaler Inhale 2 puffs into the lungs every 6 (six) hours as needed for shortness of breath. 02/09/24 03/10/24  [provider]  allopurinol  (ZYLOPRIM ) 300 MG tablet Take 300 mg by mouth 2 (two) times daily.  06/29/18    [provider]  amLODipine  (NORVASC ) 10 MG tablet Take 10 mg by mouth daily. 07/23/20   [provider]  blood glucose meter kit and supplies Dispense based on patient and insurance preference. Use up to four times daily as directed. (FOR ICD-10 E10.9, E11.9). 08/03/19   Mikhail, Maryann, DO  Blood Glucose Monitoring Suppl DEVI 1 each by Does not apply route in the morning, at noon, and at bedtime. May substitute to any manufacturer covered by patient's insurance. 10/07/23   Charmayne Cooper, MD  cetirizine (ZYRTEC) 10 MG tablet Take 10 mg by mouth daily as needed for allergies.  09/14/20   [provider]  Cyanocobalamin (VITAMIN B-12) 2500 MCG SUBL Place 1 tablet under the tongue daily.    [provider]  Dulaglutide (TRULICITY) 1.5 MG/0.5ML SOAJ Inject 1.5 mg into the skin once a week. 10/07/23   Charmayne Cooper, MD  Insulin  Pen Needle 31G X 5 MM MISC Use with insulin  pen 08/03/19   Mikhail, Maryann, DO  metFORMIN (GLUCOPHAGE) 1000 MG tablet Take 1,000 mg by mouth 2 (two) times daily. 09/16/20   [provider]  pantoprazole (PROTONIX) 40 MG tablet Take 40 mg by mouth daily. 07/23/20   [provider]  prochlorperazine  (COMPAZINE ) 10 MG tablet Take 1 tablet (10 mg total) by mouth every 8 (eight) hours as needed for nausea or vomiting. 02/23/24   Tegeler, Marine Sia, MD  rosuvastatin (CRESTOR)  20 MG tablet Take 20 mg by mouth at bedtime. 07/23/20   [provider]  telmisartan (MICARDIS) 80 MG tablet Take 80 mg by mouth daily. 07/30/20   [provider]  TOUJEO  SOLOSTAR 300 UNIT/ML Solostar Pen Inject 25 Units into the skin at bedtime. 10/07/23   Charmayne Cooper, MD    Physical Exam: Constitutional: Moderately built and nourished. Vitals:   04/19/24 1445 04/19/24 1545 04/19/24 1857 04/19/24 1901  BP: (!) 133/90 (!) 150/133  (!) 151/103  Pulse: (!) 102 (!) 106  (!) 107  Resp: (!) 26 (!) 25  (!) 25  Temp:  98.5 F  (36.9 C)  98.4 F (36.9 C)  TempSrc:  Oral  Oral  SpO2: 95% 96%  98%  Weight:   133.4 kg   Height:   6' (1.829 m)    Eyes: Anicteric no pallor. ENMT: No discharge from the ears eyes nose or mouth. Neck: JVD elevated no mass felt. Respiratory: No rhonchi or crepitations. Cardiovascular: S1-S2 heard. Abdomen: Soft nontender bowel sound present. Musculoskeletal: Bilateral lower extremity edema present. Skin: No rash. Neurologic: Alert awake oriented to time place and person.  Moves all extremities. Psychiatric: Appears normal.  Normal affect.   Labs on Admission: I have personally reviewed following labs and imaging studies  CBC: Recent Labs  Lab 04/19/24 1438  WBC 4.8  NEUTROABS 3.1  HGB 12.3*  HCT 35.8*  MCV 75.2*  PLT 331   Basic Metabolic Panel: Recent Labs  Lab 04/19/24 1438  NA 143  K 3.7  CL 107  CO2 24  GLUCOSE 136*  BUN 13  CREATININE 0.84  CALCIUM 9.1   GFR: Estimated Creatinine Clearance: 168.5 mL/min (by C-G formula based on SCr of 0.84 mg/dL). Liver Function Tests: No results for input(s): "AST", "ALT", "ALKPHOS", "BILITOT", "PROT", "ALBUMIN" in the last 168 hours. No results for input(s): "LIPASE", "AMYLASE" in the last 168 hours. No results for input(s): "AMMONIA" in the last 168 hours. Coagulation Profile: No results for input(s): "INR", "PROTIME" in the last 168 hours. Cardiac Enzymes: No results for input(s): "CKTOTAL", "CKMB", "CKMBINDEX", "TROPONINI" in the last 168 hours. BNP (last 3 results) Recent Labs    04/19/24 1438  PROBNP 1,470.0*   HbA1C: No results for input(s): "HGBA1C" in the last 72 hours. CBG: No results for input(s): "GLUCAP" in the last 168 hours. Lipid Profile: No results for input(s): "CHOL", "HDL", "LDLCALC", "TRIG", "CHOLHDL", "LDLDIRECT" in the last 72 hours. Thyroid Function Tests: No results for input(s): "TSH", "T4TOTAL", "FREET4", "T3FREE", "THYROIDAB" in the last 72 hours. Anemia Panel: No results for  input(s): "VITAMINB12", "FOLATE", "FERRITIN", "TIBC", "IRON", "RETICCTPCT" in the last 72 hours. Urine analysis:    Component Value Date/Time   COLORURINE COLORLESS (A) 10/06/2023 2013   APPEARANCEUR CLEAR 10/06/2023 2013   LABSPEC 1.025 10/06/2023 2013   PHURINE 5.5 10/06/2023 2013   GLUCOSEU >1,000 (A) 10/06/2023 2013   HGBUR NEGATIVE 10/06/2023 2013   BILIRUBINUR NEGATIVE 10/06/2023 2013   KETONESUR NEGATIVE 10/06/2023 2013   PROTEINUR TRACE (A) 10/06/2023 2013   NITRITE NEGATIVE 10/06/2023 2013   LEUKOCYTESUR NEGATIVE 10/06/2023 2013   Sepsis Labs: @LABRCNTIP (procalcitonin:4,lacticidven:4) )No results found for this or any previous visit (from the past 240 hours).   Radiological Exams on Admission: DG Chest 2 View Result Date: 04/19/2024 CLINICAL DATA:  Shortness of breath EXAM: CHEST - 2 VIEW COMPARISON:  Chest radiograph dated 07/08/2018 FINDINGS: Normal lung volumes. Left basilar patchy opacities. Trace blunting of bilateral costophrenic angles. No pneumothorax. Similar  enlarged cardiomediastinal silhouette. No acute osseous abnormality. IMPRESSION: 1. Left basilar patchy opacities, which may represent atelectasis, aspiration, or pneumonia. 2. Trace blunting of bilateral costophrenic angles, which may represent trace pleural effusions. 3. Similar cardiomegaly. Electronically Signed   By: Limin  Xu M.D.   On: 04/19/2024 15:16    EKG: Independently reviewed.  Sinus tachycardia.  Assessment/Plan Principal Problem:   Acute CHF (HCC) Active Problems:   Gout   Essential hypertension   Diabetes mellitus with hyperglycemia, without long-term current use of insulin  (HCC)   Obesity, Class III, BMI 40-49.9 (morbid obesity)   Mixed hyperlipidemia   Obstructive sleep apnea   Type 2 diabetes mellitus with microalbuminuria, with long-term current use of insulin  (HCC)   Acute CHF (congestive heart failure) (HCC)    Acute CHF unknown EF new onset -    patient's presentation is  concerning for CHF new onset unknown EF.  Was given Lasix 40 mg IV I have ordered Lasix 40 g IV every 12 for two more doses.  Closely follow intake output metabolic panel Daily weights and check 2D echo.  Based on the response to Lasix will have further doses ordered.  Consult cardiology. Elevated troponin likely from CHF.  Remains flat.  Follow 2D echo. Hypertension on amlodipine . Diabetes mellitus type 2 takes Trulicity and Toujeo  26 units at bedtime.  Patient does not recall his hemoglobin A1c.  Will check hemoglobin A1c. Hyperlipidemia on statins. History of gout on allopurinol . Sleep apnea has not been compliant with his CPAP. Anemia check anemia.  Will follow CBC.  Since patient has new onset CHF will need close monitoring and more than 2 midnight stay.   DVT prophylaxis: Lovenox . Code Status: Full code. Family Communication: Patient's family at the bedside. Disposition Plan: Cardiac telemetry. Consults called: Cardiology. Admission status: Observation.

## 2024-04-19 NOTE — ED Provider Notes (Signed)
 [place] EMERGENCY DEPARTMENT AT [facility] Provider Note   CSN: [id number] Arrival date & time: 04/19/24  1323     History HTN, DM Chief Complaint  Patient presents with   Leg Swelling    Christopher Hays is a 38 y.o. male.  38 y.o male with a PMH of HTN, DM presents to the ED with a chief complaint of bilateral leg swelling x 1 month. Patient reports his symptoms began with a wet cough, he has had this for about 2 months, over the last 4 weeks, he is continue to notice that his legs appear severely swollen throughout the day.  He is also endorsing swelling orthopnea, reports he usually sleeps with 3 pillows under his back.  Patient reports that last night he began to feel some chest pressure while lying down, states that he had to sit up, felt really unwell but episode self resolved.  He has not taken any medication for improvement in his symptoms.  He is a diabetic at baseline, reports no changes in his blood sugar lately.  No prior hx of CHF, no fever, no trauma or prior hx of blood clots.   The history is provided by the patient.       Home Medications Prior to Admission medications   Medication Sig Start Date End Date Taking? Authorizing Provider  albuterol (VENTOLIN HFA) 108 (90 Base) MCG/ACT inhaler Inhale 2 puffs into the lungs every 6 (six) hours as needed for shortness of breath. 02/09/24 03/10/24  [provider]  allopurinol  (ZYLOPRIM ) 300 MG tablet Take 300 mg by mouth 2 (two) times daily.  06/29/18   [provider]  amLODipine  (NORVASC ) 10 MG tablet Take 10 mg by mouth daily. 07/23/20   [provider]  blood glucose meter kit and supplies Dispense based on patient and insurance preference. Use up to four times daily as directed. (FOR ICD-10 E10.9, E11.9). 08/03/19   Mikhail, Maryann, DO  Blood Glucose Monitoring Suppl DEVI 1 each by Does not apply route in the morning, at noon, and at bedtime. May substitute to any manufacturer covered by  patient's insurance. 10/07/23   Charmayne Cooper, MD  cetirizine (ZYRTEC) 10 MG tablet Take 10 mg by mouth daily as needed for allergies.  09/14/20   [provider]  Cyanocobalamin (VITAMIN B-12) 2500 MCG SUBL Place 1 tablet under the tongue daily.    [provider]  Dulaglutide (TRULICITY) 1.5 MG/0.5ML SOAJ Inject 1.5 mg into the skin once a week. 10/07/23   Charmayne Cooper, MD  Insulin  Pen Needle 31G X 5 MM MISC Use with insulin  pen 08/03/19   Mikhail, Maryann, DO  metFORMIN (GLUCOPHAGE) 1000 MG tablet Take 1,000 mg by mouth 2 (two) times daily. 09/16/20   [provider]  pantoprazole (PROTONIX) 40 MG tablet Take 40 mg by mouth daily. 07/23/20   [provider]  prochlorperazine  (COMPAZINE ) 10 MG tablet Take 1 tablet (10 mg total) by mouth every 8 (eight) hours as needed for nausea or vomiting. 02/23/24   Tegeler, Marine Sia, MD  rosuvastatin  (CRESTOR ) 20 MG tablet Take 20 mg by mouth at bedtime. 07/23/20   [provider]  telmisartan (MICARDIS) 80 MG tablet Take 80 mg by mouth daily. 07/30/20   [provider]  TOUJEO  SOLOSTAR 300 UNIT/ML Solostar Pen Inject 25 Units into the skin at bedtime. 10/07/23   Charmayne Cooper, MD      Allergies    Patient has no known allergies.  Review of Systems   Review of Systems  Constitutional:  Negative for chills and fever.  Respiratory:  Positive for shortness of breath.   Cardiovascular:  Positive for chest pain, palpitations and leg swelling.  Gastrointestinal:  Negative for abdominal pain, constipation, nausea and vomiting.  Genitourinary:  Negative for flank pain.  Musculoskeletal:  Negative for back pain.  All other systems reviewed and are negative.   Physical Exam Updated Vital Signs BP (!) 150/133   Pulse (!) 106   Temp 98.5 F (36.9 C)   Resp (!) 25   Ht 6' (1.829 m)   Wt 136.1 kg   SpO2 96%   BMI 40.69 kg/m  Physical Exam Vitals and nursing note reviewed.   Constitutional:      Appearance: Normal appearance.  HENT:     Head: Normocephalic and atraumatic.     Mouth/Throat:     Mouth: Mucous membranes are moist.  Eyes:     Pupils: Pupils are equal, round, and reactive to light.  Cardiovascular:     Rate and Rhythm: Tachycardia present.     Comments: 1+ pitting edema bilaterally  Pulmonary:     Breath sounds: Decreased breath sounds present. No rales.     Comments: Decreased breath sounds throughout.  Abdominal:     General: Abdomen is flat. There is distension.     Palpations: Abdomen is soft.  Musculoskeletal:     Cervical back: Normal range of motion and neck supple.     Right lower leg: 1+ Pitting Edema present.     Left lower leg: 1+ Pitting Edema present.  Skin:    General: Skin is warm and dry.  Neurological:     Mental Status: He is alert and oriented to person, place, and time.     ED Results / Procedures / Treatments   Labs (all labs ordered are listed, but only abnormal results are displayed) Labs Reviewed  BASIC METABOLIC PANEL WITH GFR - Abnormal; Notable for the following components:      Result Value   Glucose, Bld 136 (*)    All other components within normal limits  CBC WITH DIFFERENTIAL/PLATELET - Abnormal; Notable for the following components:   Hemoglobin 12.3 (*)    HCT 35.8 (*)    MCV 75.2 (*)    MCH 25.8 (*)    All other components within normal limits  PRO BRAIN NATRIURETIC PEPTIDE - Abnormal; Notable for the following components:   Pro Brain Natriuretic Peptide 1,470.0 (*)    All other components within normal limits  TROPONIN T, HIGH SENSITIVITY - Abnormal; Notable for the following components:   Troponin T High Sensitivity 70 (*)    All other components within normal limits  TROPONIN T, HIGH SENSITIVITY    EKG EKG Interpretation Date/Time:  Thursday Apr 19 2024 14:45:49 EDT Ventricular Rate:  103 PR Interval:  192 QRS Duration:  91 QT Interval:  439 QTC Calculation: 575 R  Axis:   -20  Text Interpretation: Sinus tachycardia Probable left atrial enlargement Borderline left axis deviation Anterior infarct, old Nonspecific T abnormalities, lateral leads Prolonged QT interval Confirmed by Lind Repine ([id number]) on 04/19/2024 3:45:59 PM  Radiology DG Chest 2 View Result Date: 04/19/2024 CLINICAL DATA:  Shortness of breath EXAM: CHEST - 2 VIEW COMPARISON:  Chest radiograph dated 07/08/2018 FINDINGS: Normal lung volumes. Left basilar patchy opacities. Trace blunting of bilateral costophrenic angles. No pneumothorax. Similar enlarged cardiomediastinal silhouette. No acute osseous abnormality. IMPRESSION: 1. Left basilar patchy opacities, which  may represent atelectasis, aspiration, or pneumonia. 2. Trace blunting of bilateral costophrenic angles, which may represent trace pleural effusions. 3. Similar cardiomegaly. Electronically Signed   By: Limin  Xu M.D.   On: 04/19/2024 15:16    Procedures Procedures    Medications Ordered in ED Medications  furosemide  (LASIX ) injection 40 mg (40 mg Intravenous Given 04/19/24 1538)    ED Course/ Medical Decision Making/ A&P Clinical Course as of 04/19/24 1633  Thu Apr 19, 2024  1521 Pro Brain Natriuretic Peptide(!): 1,470.0 [JS]  1521 Troponin T High Sensitivity(!): 70 [JS]    Clinical Course User Index [JS] Ave Scharnhorst, PA-C                                 Medical Decision Making Amount and/or Complexity of Data Reviewed Labs: ordered. Decision-making details documented in ED Course. Radiology: ordered.  Risk Prescription drug management. Decision regarding hospitalization.   This patient presents to the ED for concern of bilateral leg swelling, this involves a number of treatment options, and is a complaint that carries with it a high risk of complications and morbidity.  The differential diagnosis includes infection, CHF exacerbation versus DVT.    Co morbidities: Discussed in HPI   Brief History:  See HPI.    EMR reviewed including pt PMHx, past surgical history and past visits to ER.   See HPI for more details   Lab Tests:  I ordered and independently interpreted labs.  The pertinent results include:    Labs notable for CBC with no leukocytosis, hemoglobin is within normal limits.  BMP with no electrolyte derangement, potassium is normal. Levels unremarkable.  BNP is elevated at 1, 470.  Troponin is elevated at 70.  Imaging Studies:  IMPRESSION:  1. Left basilar patchy opacities, which may represent atelectasis,  aspiration, or pneumonia.  2. Trace blunting of bilateral costophrenic angles, which may  represent trace pleural effusions.  3. Similar cardiomegaly.   Cardiac Monitoring:  The patient was maintained on a cardiac monitor.  I personally viewed and interpreted the cardiac monitored which showed an underlying rhythm of: Sinus tachycardia  EKG non-ischemic   Medicines ordered:  I ordered medication including lasix   for diuresis  Reevaluation of the patient after these medicines showed that the patient stayed the same I have reviewed the patients home medicines and have made adjustments as needed  Reevaluation:  After the interventions noted above I re-evaluated patient and found that they have :stayed the same  Social Determinants of Health:  The patient's social determinants of health were a factor in the care of this patient   Problem List / ED Course:  Patient presents to the ED with a chief complaint of shortness of breath, leg swelling this been ongoing for approximately 2 months, reports he has also had a wet cough over the last couple of weeks.  He is complaining that he is also unable to lay flat in bed as he had some orthopnea, placing about 3 pillows under his back.  In addition, he feels like last night he had severe pressure to his chest, felt like he could not take a deep breath.  This has not occurred in the past, he does not have any cardiac history, no  tobacco use, is diabetic and has a history of hypertension. On primary evaluation patient does have 1+ pitting edema bilaterally, there is distant lung sounds on exam, he appears to be  fluid overloaded.  Heart rate is slightly fast and he appears tachypneic with low oxygen saturation at 95%.  Labs CBC with any leukocytes, hemoglobin is within normal limits.  BMP with no electrolyte derangement, creatinine levels unremarkable.  Troponin is elevated at 70, BNP significant elevated at 1, 470, no prior history of heart failure, I do feel that this is a presentation of new onset heart failure.  Potassium is normal, will proceed with 40 mg of Lasix  for diuresis.  Will place call to hospitalist for further admission.  Patient has been informed of results, and agrees with admission at this time. Spoke to Dr. Rufina Cough, hospitalist who will admit patient for further management. Appreciate his assistance.   Dispostion:  After consideration of the diagnostic results and the patients response to treatment, I feel that the patent would benefit from admission for further management of his acute exacerbation of heart failure.   Portions of this note were generated with Scientist, clinical (histocompatibility and immunogenetics). Dictation errors may occur despite best attempts at proofreading.   Final Clinical Impression(s) / ED Diagnoses Final diagnoses:  Shortness of breath  New onset of congestive heart failure [facility])    Rx / DC Orders ED Discharge Orders     None         Alicea Wente, PA-C 04/19/24 1633    Hershel Los, MD 04/25/24 [phone]

## 2024-04-19 NOTE — ED Provider Notes (Signed)
 I provided a substantive portion of the care of this patient.  I personally made/approved the management plan for this patient and take responsibility for the patient management.  EKG Interpretation Date/Time:  Thursday Apr 19 2024 14:45:49 EDT Ventricular Rate:  103 PR Interval:  192 QRS Duration:  91 QT Interval:  439 QTC Calculation: 575 R Axis:   -20  Text Interpretation: Sinus tachycardia Probable left atrial enlargement Borderline left axis deviation Anterior infarct, old Nonspecific T abnormalities, lateral leads Prolonged QT interval Confirmed by Lind Repine ([id number]) on 04/19/2024 3:45:59 PM   Patient's EKG shows sinus tachycardia.  Patient here complaining of swelling to his legs as well as acute shortness of breath.  Chest x-ray concerning for fluid overload.  Confirmed with BNP as elevated.  Patient has new onset CHF and will be given Lasix and admitted to the hospitalist team   Lind Repine, MD 04/19/24 1606

## 2024-04-19 NOTE — ED Triage Notes (Signed)
 In for eval of leg swelling from knees down bilaterally onset 2-3 weeks ago with slight pain in bilateral feet.

## 2024-04-20 ENCOUNTER — Encounter (HOSPITAL_COMMUNITY)
Admission: EM | Disposition: A | Discharge status: Home / Self Care | Payer: Self-pay | Source: Home / Self Care | Attending: Internal Medicine

## 2024-04-20 ENCOUNTER — Observation Stay (HOSPITAL_COMMUNITY)

## 2024-04-20 ENCOUNTER — Telehealth (HOSPITAL_COMMUNITY): Payer: Self-pay | Admitting: Pharmacy Technician

## 2024-04-20 ENCOUNTER — Other Ambulatory Visit (HOSPITAL_COMMUNITY): Payer: Self-pay

## 2024-04-20 DIAGNOSIS — I428 Other cardiomyopathies: Secondary | ICD-10-CM | POA: Diagnosis present

## 2024-04-20 DIAGNOSIS — Z79899 Other long term (current) drug therapy: Secondary | ICD-10-CM | POA: Diagnosis not present

## 2024-04-20 DIAGNOSIS — Z6839 Body mass index (BMI) 39.0-39.9, adult: Secondary | ICD-10-CM | POA: Diagnosis not present

## 2024-04-20 DIAGNOSIS — R0602 Shortness of breath: Secondary | ICD-10-CM | POA: Diagnosis present

## 2024-04-20 DIAGNOSIS — D509 Iron deficiency anemia, unspecified: Secondary | ICD-10-CM | POA: Diagnosis present

## 2024-04-20 DIAGNOSIS — I110 Hypertensive heart disease with heart failure: Secondary | ICD-10-CM | POA: Diagnosis present

## 2024-04-20 DIAGNOSIS — M109 Gout, unspecified: Secondary | ICD-10-CM | POA: Diagnosis present

## 2024-04-20 DIAGNOSIS — G4733 Obstructive sleep apnea (adult) (pediatric): Secondary | ICD-10-CM | POA: Diagnosis not present

## 2024-04-20 DIAGNOSIS — Z794 Long term (current) use of insulin: Secondary | ICD-10-CM | POA: Diagnosis not present

## 2024-04-20 DIAGNOSIS — Z91199 Patient's noncompliance with other medical treatment and regimen due to unspecified reason: Secondary | ICD-10-CM | POA: Diagnosis not present

## 2024-04-20 DIAGNOSIS — I272 Pulmonary hypertension, unspecified: Secondary | ICD-10-CM | POA: Diagnosis present

## 2024-04-20 DIAGNOSIS — I5043 Acute on chronic combined systolic (congestive) and diastolic (congestive) heart failure: Secondary | ICD-10-CM | POA: Diagnosis not present

## 2024-04-20 DIAGNOSIS — E782 Mixed hyperlipidemia: Secondary | ICD-10-CM | POA: Diagnosis not present

## 2024-04-20 DIAGNOSIS — Z560 Unemployment, unspecified: Secondary | ICD-10-CM | POA: Diagnosis not present

## 2024-04-20 DIAGNOSIS — E1165 Type 2 diabetes mellitus with hyperglycemia: Secondary | ICD-10-CM | POA: Diagnosis present

## 2024-04-20 DIAGNOSIS — I5021 Acute systolic (congestive) heart failure: Secondary | ICD-10-CM

## 2024-04-20 DIAGNOSIS — I2510 Atherosclerotic heart disease of native coronary artery without angina pectoris: Secondary | ICD-10-CM | POA: Diagnosis present

## 2024-04-20 DIAGNOSIS — Z5986 Financial insecurity: Secondary | ICD-10-CM | POA: Diagnosis not present

## 2024-04-20 DIAGNOSIS — Z7984 Long term (current) use of oral hypoglycemic drugs: Secondary | ICD-10-CM | POA: Diagnosis not present

## 2024-04-20 DIAGNOSIS — I509 Heart failure, unspecified: Secondary | ICD-10-CM | POA: Diagnosis not present

## 2024-04-20 DIAGNOSIS — E66813 Obesity, class 3: Secondary | ICD-10-CM | POA: Diagnosis not present

## 2024-04-20 DIAGNOSIS — I3139 Other pericardial effusion (noninflammatory): Secondary | ICD-10-CM | POA: Diagnosis not present

## 2024-04-20 DIAGNOSIS — Z7985 Long-term (current) use of injectable non-insulin antidiabetic drugs: Secondary | ICD-10-CM | POA: Diagnosis not present

## 2024-04-20 DIAGNOSIS — I5041 Acute combined systolic (congestive) and diastolic (congestive) heart failure: Secondary | ICD-10-CM | POA: Diagnosis not present

## 2024-04-20 HISTORY — PX: RIGHT/LEFT HEART CATH AND CORONARY ANGIOGRAPHY: CATH118266

## 2024-04-20 LAB — POCT I-STAT EG7
Acid-Base Excess: 3 mmol/L — ABNORMAL HIGH (ref 0.0–2.0)
Acid-Base Excess: 3 mmol/L — ABNORMAL HIGH (ref 0.0–2.0)
Bicarbonate: 27.8 mmol/L (ref 20.0–28.0)
Bicarbonate: 28.4 mmol/L — ABNORMAL HIGH (ref 20.0–28.0)
Calcium, Ion: 1.2 mmol/L (ref 1.15–1.40)
Calcium, Ion: 1.21 mmol/L (ref 1.15–1.40)
HCT: 35 % — ABNORMAL LOW (ref 39.0–52.0)
HCT: 35 % — ABNORMAL LOW (ref 39.0–52.0)
Hemoglobin: 11.9 g/dL — ABNORMAL LOW (ref 13.0–17.0)
Hemoglobin: 11.9 g/dL — ABNORMAL LOW (ref 13.0–17.0)
O2 Saturation: 65 %
O2 Saturation: 70 %
Potassium: 3.1 mmol/L — ABNORMAL LOW (ref 3.5–5.1)
Potassium: 3.1 mmol/L — ABNORMAL LOW (ref 3.5–5.1)
Sodium: 144 mmol/L (ref 135–145)
Sodium: 144 mmol/L (ref 135–145)
TCO2: 29 mmol/L (ref 22–32)
TCO2: 30 mmol/L (ref 22–32)
pCO2, Ven: 43.2 mmHg — ABNORMAL LOW (ref 44–60)
pCO2, Ven: 44.1 mmHg (ref 44–60)
pH, Ven: 7.416 (ref 7.25–7.43)
pH, Ven: 7.418 (ref 7.25–7.43)
pO2, Ven: 33 mmHg (ref 32–45)
pO2, Ven: 36 mmHg (ref 32–45)

## 2024-04-20 LAB — CBC WITH DIFFERENTIAL/PLATELET
Abs Immature Granulocytes: 0.01 10*3/uL (ref 0.00–0.07)
Basophils Absolute: 0 10*3/uL (ref 0.0–0.1)
Basophils Relative: 1 %
Eosinophils Absolute: 0.1 10*3/uL (ref 0.0–0.5)
Eosinophils Relative: 2 %
HCT: 36.8 % — ABNORMAL LOW (ref 39.0–52.0)
Hemoglobin: 12.3 g/dL — ABNORMAL LOW (ref 13.0–17.0)
Immature Granulocytes: 0 %
Lymphocytes Relative: 32 %
Lymphs Abs: 1.2 10*3/uL (ref 0.7–4.0)
MCH: 25.4 pg — ABNORMAL LOW (ref 26.0–34.0)
MCHC: 33.4 g/dL (ref 30.0–36.0)
MCV: 76 fL — ABNORMAL LOW (ref 80.0–100.0)
Monocytes Absolute: 0.4 10*3/uL (ref 0.1–1.0)
Monocytes Relative: 9 %
Neutro Abs: 2.2 10*3/uL (ref 1.7–7.7)
Neutrophils Relative %: 56 %
Platelets: 349 10*3/uL (ref 150–400)
RBC: 4.84 MIL/uL (ref 4.22–5.81)
RDW: 13.4 % (ref 11.5–15.5)
WBC: 3.9 10*3/uL — ABNORMAL LOW (ref 4.0–10.5)
nRBC: 0 % (ref 0.0–0.2)

## 2024-04-20 LAB — POCT I-STAT 7, (LYTES, BLD GAS, ICA,H+H)
Acid-Base Excess: 1 mmol/L (ref 0.0–2.0)
Bicarbonate: 24.7 mmol/L (ref 20.0–28.0)
Calcium, Ion: 1.08 mmol/L — ABNORMAL LOW (ref 1.15–1.40)
HCT: 33 % — ABNORMAL LOW (ref 39.0–52.0)
Hemoglobin: 11.2 g/dL — ABNORMAL LOW (ref 13.0–17.0)
O2 Saturation: 94 %
Potassium: 2.9 mmol/L — ABNORMAL LOW (ref 3.5–5.1)
Sodium: 146 mmol/L — ABNORMAL HIGH (ref 135–145)
TCO2: 26 mmol/L (ref 22–32)
pCO2 arterial: 34.9 mmHg (ref 32–48)
pH, Arterial: 7.457 — ABNORMAL HIGH (ref 7.35–7.45)
pO2, Arterial: 68 mmHg — ABNORMAL LOW (ref 83–108)

## 2024-04-20 LAB — BASIC METABOLIC PANEL WITH GFR
Anion gap: 8 (ref 5–15)
BUN: 10 mg/dL (ref 6–20)
CO2: 29 mmol/L (ref 22–32)
Calcium: 8.7 mg/dL — ABNORMAL LOW (ref 8.9–10.3)
Chloride: 105 mmol/L (ref 98–111)
Creatinine, Ser: 0.9 mg/dL (ref 0.61–1.24)
GFR, Estimated: >60 mL/min (ref >60)
Glucose, Bld: 132 mg/dL — ABNORMAL HIGH (ref 70–99)
Potassium: 3.9 mmol/L (ref 3.5–5.1)
Sodium: 142 mmol/L (ref 135–145)

## 2024-04-20 LAB — GLUCOSE, CAPILLARY
Glucose-Capillary: 123 mg/dL — ABNORMAL HIGH (ref 70–99)
Glucose-Capillary: 133 mg/dL — ABNORMAL HIGH (ref 70–99)
Glucose-Capillary: 155 mg/dL — ABNORMAL HIGH (ref 70–99)
Glucose-Capillary: 112 mg/dL — ABNORMAL HIGH (ref 70–99)
Glucose-Capillary: 88 mg/dL (ref 70–99)
Glucose-Capillary: 131 mg/dL — ABNORMAL HIGH (ref 70–99)

## 2024-04-20 LAB — CBC
HCT: 37.5 % — ABNORMAL LOW (ref 39.0–52.0)
Hemoglobin: 12.8 g/dL — ABNORMAL LOW (ref 13.0–17.0)
MCH: 25.3 pg — ABNORMAL LOW (ref 26.0–34.0)
MCHC: 34.1 g/dL (ref 30.0–36.0)
MCV: 74.3 fL — ABNORMAL LOW (ref 80.0–100.0)
Platelets: 366 10*3/uL (ref 150–400)
RBC: 5.05 MIL/uL (ref 4.22–5.81)
RDW: 13.4 % (ref 11.5–15.5)
WBC: 5.4 10*3/uL (ref 4.0–10.5)
nRBC: 0 % (ref 0.0–0.2)

## 2024-04-20 LAB — HIV ANTIBODY (ROUTINE TESTING W REFLEX): HIV Screen 4th Generation wRfx: NONREACTIVE

## 2024-04-20 LAB — ECHOCARDIOGRAM COMPLETE
Height: 72 in
S' Lateral: 3.6 cm
Weight: 4634.95 [oz_av]

## 2024-04-20 LAB — CREATININE, SERUM
Creatinine, Ser: 0.91 mg/dL (ref 0.61–1.24)
GFR, Estimated: >60 mL/min (ref >60)

## 2024-04-20 SURGERY — RIGHT/LEFT HEART CATH AND CORONARY ANGIOGRAPHY
Anesthesia: LOCAL

## 2024-04-20 MED ORDER — HEPARIN (PORCINE) IN NACL 1000-0.9 UT/500ML-% IV SOLN
INTRAVENOUS | Status: DC | PRN
Start: 2024-04-20 — End: 2024-04-20
  Administered 2024-04-20: 1000 mL via INTRA_ARTERIAL

## 2024-04-20 MED ORDER — SODIUM CHLORIDE 0.9 % IV SOLN: INTRAVENOUS | Status: DC

## 2024-04-20 MED ORDER — HEPARIN SODIUM (PORCINE) 1000 UNIT/ML IJ SOLN
INTRAMUSCULAR | Status: AC
  Filled 2024-04-20: qty 10

## 2024-04-20 MED ORDER — SODIUM CHLORIDE 0.9 % IV SOLN: 250.0000 mL | INTRAVENOUS | Status: AC | PRN

## 2024-04-20 MED ORDER — FENTANYL CITRATE (PF) 100 MCG/2ML IJ SOLN
INTRAMUSCULAR | Status: AC
  Filled 2024-04-20: qty 2

## 2024-04-20 MED ORDER — SACUBITRIL-VALSARTAN 49-51 MG PO TABS
1.0000 | ORAL_TABLET | Freq: Two times a day (BID) | ORAL | Status: DC
  Administered 2024-04-20 – 2024-04-21 (×3): 1 via ORAL
  Filled 2024-04-20 (×3): qty 1

## 2024-04-20 MED ORDER — SODIUM CHLORIDE 0.9% FLUSH: 3.0000 mL | INTRAVENOUS | Status: DC | PRN

## 2024-04-20 MED ORDER — SPIRONOLACTONE 25 MG PO TABS
25.0000 mg | ORAL_TABLET | Freq: Every day | ORAL | Status: DC
  Administered 2024-04-21 – 2024-04-23 (×3): 25 mg via ORAL
  Filled 2024-04-20 (×3): qty 1

## 2024-04-20 MED ORDER — SPIRONOLACTONE 12.5 MG HALF TABLET
12.5000 mg | ORAL_TABLET | Freq: Every day | ORAL | Status: DC
  Administered 2024-04-20: 12.5 mg via ORAL
  Filled 2024-04-20: qty 1

## 2024-04-20 MED ORDER — HEPARIN (PORCINE) IN NACL 2-0.9 UNITS/ML
INTRAMUSCULAR | Status: DC | PRN
  Administered 2024-04-20: 10 mL via INTRA_ARTERIAL

## 2024-04-20 MED ORDER — IOHEXOL 350 MG/ML SOLN
INTRAVENOUS | Status: DC | PRN
  Administered 2024-04-20: 50 mL via INTRA_ARTERIAL

## 2024-04-20 MED ORDER — ASPIRIN 81 MG PO CHEW
CHEWABLE_TABLET | ORAL | Status: AC
  Filled 2024-04-20: qty 1

## 2024-04-20 MED ORDER — ONDANSETRON HCL 4 MG/2ML IJ SOLN: 4.0000 mg | Freq: Four times a day (QID) | INTRAMUSCULAR | Status: DC | PRN

## 2024-04-20 MED ORDER — ASPIRIN 81 MG PO CHEW
81.0000 mg | CHEWABLE_TABLET | Freq: Once | ORAL | Status: AC
  Administered 2024-04-20: 81 mg via ORAL

## 2024-04-20 MED ORDER — FUROSEMIDE 10 MG/ML IJ SOLN
40.0000 mg | Freq: Two times a day (BID) | INTRAMUSCULAR | Status: DC
  Administered 2024-04-20 – 2024-04-21 (×2): 40 mg via INTRAVENOUS
  Filled 2024-04-20: qty 4

## 2024-04-20 MED ORDER — LIDOCAINE HCL (PF) 1 % IJ SOLN
INTRAMUSCULAR | Status: AC
  Filled 2024-04-20: qty 30

## 2024-04-20 MED ORDER — HEPARIN SODIUM (PORCINE) 1000 UNIT/ML IJ SOLN
INTRAMUSCULAR | Status: DC | PRN
  Administered 2024-04-20: 6000 [IU] via INTRA_ARTERIAL

## 2024-04-20 MED ORDER — HYDRALAZINE HCL 20 MG/ML IJ SOLN: 10.0000 mg | INTRAMUSCULAR | Status: AC | PRN

## 2024-04-20 MED ORDER — SODIUM CHLORIDE 0.9% FLUSH
3.0000 mL | Freq: Two times a day (BID) | INTRAVENOUS | Status: DC
  Administered 2024-04-20 – 2024-04-22 (×5): 3 mL via INTRAVENOUS

## 2024-04-20 MED ORDER — DIGOXIN 125 MCG PO TABS
0.1250 mg | ORAL_TABLET | Freq: Every day | ORAL | Status: DC
  Administered 2024-04-20 – 2024-04-23 (×4): 0.125 mg via ORAL
  Filled 2024-04-20 (×4): qty 1

## 2024-04-20 MED ORDER — ENOXAPARIN SODIUM 40 MG/0.4ML IJ SOSY: 40.0000 mg | PREFILLED_SYRINGE | INTRAMUSCULAR | Status: DC

## 2024-04-20 MED ORDER — FENTANYL CITRATE (PF) 100 MCG/2ML IJ SOLN
INTRAMUSCULAR | Status: DC | PRN
  Administered 2024-04-20: 25 ug via INTRAVENOUS

## 2024-04-20 MED ORDER — MIDAZOLAM HCL 2 MG/2ML IJ SOLN
INTRAMUSCULAR | Status: AC
Start: 2024-04-20 — End: ?
  Filled 2024-04-20: qty 2

## 2024-04-20 MED ORDER — MIDAZOLAM HCL 2 MG/2ML IJ SOLN
INTRAMUSCULAR | Status: DC | PRN
  Administered 2024-04-20: 2 mg via INTRAVENOUS

## 2024-04-20 MED ORDER — LIDOCAINE HCL (PF) 1 % IJ SOLN
INTRAMUSCULAR | Status: DC | PRN
  Administered 2024-04-20 (×2): 2 mL

## 2024-04-20 MED ORDER — VERAPAMIL HCL 2.5 MG/ML IV SOLN
INTRAVENOUS | Status: AC
  Filled 2024-04-20: qty 2

## 2024-04-20 MED ORDER — ACETAMINOPHEN 325 MG PO TABS: 650.0000 mg | ORAL_TABLET | ORAL | Status: DC | PRN

## 2024-04-20 SURGICAL SUPPLY — 12 items
CATH 5FR JL3.5 JR4 ANG PIG MP (CATHETERS) IMPLANT
CATH SWAN GANZ 7F STRAIGHT (CATHETERS) IMPLANT
DEVICE RAD COMP TR BAND LRG (VASCULAR PRODUCTS) IMPLANT
GLIDESHEATH SLEND SS 6F .021 (SHEATH) IMPLANT
GLIDESHEATH SLENDER 7FR .021G (SHEATH) IMPLANT
GUIDEWIRE .025 260CM (WIRE) IMPLANT
GUIDEWIRE INQWIRE 1.5J.035X260 (WIRE) IMPLANT
KIT HEART LEFT (KITS) IMPLANT
KIT MICROPUNCTURE NIT STIFF (SHEATH) IMPLANT
PACK CARDIAC CATHETERIZATION (CUSTOM PROCEDURE TRAY) ×1 IMPLANT
SHEATH PROBE COVER 6X72 (BAG) IMPLANT
TUBING ART PRESS 72 MALE/FEM (TUBING) IMPLANT

## 2024-04-20 NOTE — Progress Notes (Signed)
   Heart Failure Stewardship Pharmacist Progress Note   PCP: [employer] [place] PCP-Cardiologist: None    HPI:  38 yo M with PMH of HTN, HLD, sleep apnea, gout, and T2DM.   Presented to the ED on 5/8 with shortness of breath, LE edema, orthopnea, and chest pressure. No prior history of CHF. Was seen a month ago in urgent care for nonproductive cough and was treated for bronchitis with steroids for 5 days. EKG noted sinus tachycardia. CXR with left basilar patchy opacities, trace pleural effusions, and cardiomegaly. proBNP 1470. Started on IV lasix . ECHO 5/9 with LVEF 30-35%, global hypokinesis, moderate LVH, RV normal, moderate pericardial effusion, mild MR. Cardiology has been consulted.   Met with patient and his mother at bedside. States that he is still short of breath on exertion. Cannot lay flat yet. Still with LE edema on exam. Reviewed new ECHO findings, goals of medications, and disease state management. [facility]. Willing to utilize [facility] pharmacy at discharge.   Current HF Medications: MRA: spironolactone 12.5 mg daily  Prior to admission HF Medications: None  Pertinent Lab Values: Serum creatinine 0.90, BUN 10, Potassium 3.9, Sodium 142, proBNP 1470, Magnesium 1.4 (5/8), A1c 7.6   Vital Signs: Weight: 289 lbs (admission weight: 294 lbs) Blood pressure: 130/90s  Heart rate: 90-100s  I/O: incomplete  Medication Assistance / Insurance Benefits Check: Does the patient have prescription insurance?  Yes Type of insurance plan: [place] Medicaid  Outpatient Pharmacy:  [facility] Is the patient willing to use [facility] pharmacy at discharge? Yes Is the patient willing to transition their outpatient pharmacy to utilize a [facility] outpatient pharmacy?   No    Assessment: 1. Acute systolic CHF (LVEF 30-35%), due to unknown etiology. NYHA class III symptoms. - Now off IV lasix . Last dose of 40 mg IV given this  morning. Likely needs additional dose this evening. Strict I/Os and daily weights. Keep K>4 and Mg>2.   - Consider starting carvedilol once euvolemic - Consider adding Entresto 24/26 mg BID - Agree with starting spironolactone 12.5 mg daily today - Consider starting Farxiga 10 mg daily prior to discharge  Plan: 1) Medication changes recommended at this time: - Restart IV lasix  40 mg BID - Add Entresto 24/26 mg BID  2) Patient assistance: Viola Greulich copay $4 - Farxiga/Jardiance copay $4  3)  Education  - Initial education completed - Full education to be completed prior to discharge  Jerilyn Monte, PharmD, BCPS Heart Failure Engineer, building services Phone [phone]

## 2024-04-20 NOTE — Progress Notes (Signed)
 Heart Failure Nurse Navigator Progress Note  PCP: [facility] PCP-Cardiologist: none Admission Diagnosis: acute CHF Admitted from: home  Presentation:   Christopher Hays presented to the ED on 5/8 with shortness of breath, LE edema, orthopnea, and chest pressure. No prior history of CHF. Was seen a month ago in urgent care for nonproductive cough and was treated for bronchitis with steroids for 5 days. EKG noted sinus tachycardia. CXR with left basilar patchy opacities, trace pleural effusions, and cardiomegaly. proBNP 1470. Started on IV lasix . ECHO 5/9 with LVEF 30-35%, global hypokinesis, moderate LVH, RV normal, moderate pericardial effusion, mild MR. Cardiology has been consulted.   ECHO/ LVEF: 30-35%  Education Assessment and Provision:  Detailed education and instructions provided on heart failure disease management including the following:  Signs and symptoms of Heart Failure When to call the physician Importance of daily weights Low sodium diet Fluid restriction Medication management Anticipated future follow-up appointments  Patient education given on each of the above topics.  Patient acknowledges understanding via teach back method and acceptance of all instructions.  Education Materials:  "Living Better With Heart Failure" Booklet, HF zone tool, & Daily Weight Tracker Tool.  Patient has scale at home: yes Patient has pill box at home: no    Clinical Course:  Past Medical History:  Diagnosis Date   Diabetes mellitus without complication (HCC)    Gout    Hypertension    Sleep apnea      Social History   Socioeconomic History   Marital status: Married    Spouse name: Not on file   Number of children: Not on file   Years of education: Not on file   Highest education level: Not on file  Occupational History   Occupation: unemployed due to covid  Tobacco Use   Smoking status: Never   Smokeless tobacco: Never  Vaping Use   Vaping status:  Never Used  Substance and Sexual Activity   Alcohol use: Yes    Comment: occasional    Drug use: No   Sexual activity: Not on file  Other Topics Concern   Not on file  Social History Narrative   Not on file   Social Drivers of Health   Financial Resource Strain: High Risk (09/03/2021)   Received from [facility]   Overall Financial Resource Strain (CARDIA)    Difficulty of Paying Living Expenses: Very hard  Food Insecurity: Low Risk  (03/20/2024)   Received from [facility]   Hunger Vital Sign    Within the past 12 months, you worried that your food would run out before you got money to buy more: Not on file    Ran Out of Food in the Last Year: Never true  Transportation Needs: No Transportation Needs (03/20/2024)   Received from [facility]    In the past 12 months, has lack of reliable transportation kept you from medical appointments, meetings, work or from getting things needed for daily living? : No  Physical Activity: Insufficiently Active (09/03/2021)   [facility]   Exercise Vital Sign    Days of Exercise per Week: 3 days    Minutes of Exercise per Session: 30 min  Stress: Stress Concern Present (09/03/2021)   Received from [facility] - Occupational Stress Questionnaire    Feeling of Stress : Rather much  Social Connections: Unknown (04/19/2022)   Received from [facility]   Social Network  Social Network: Not on file    High Risk Criteria for Readmission and/or Poor Patient Outcomes: Heart failure hospital admissions (last 6 months): 0  No Show rate: 17% Difficult social situation: no Demonstrates medication adherence: yes Primary Language: English Literacy level: reading, writing, comprehension  Barriers of Care:   Diet/fluids  Considerations/Referrals:   Referral made to Heart Failure Pharmacist Stewardship: yes Referral made to Heart Failure CSW/NCM TOC: no Referral made  to Heart & Vascular TOC clinic: yes, scheduled for 5/19  Items for Follow-up on DC/TOC: GDMT optimization Volume status Further CHF workup if not completed in the hospital  Jerilyn Monte, PharmD, BCPS Heart Failure Stewardship Pharmacist Phone [phone]

## 2024-04-20 NOTE — Progress Notes (Signed)
 PROGRESS NOTE    Christopher Hays  ZOX:[id number] DOB: November 16, 1986 DOA: 04/19/2024 PCP: [facility]   37/M wDM 2, hypertension, hyperlipidemia, sleep apnea, gout presented to the ED with edema, dyspnea, cough over the last few months followed by orthopnea. -In the ED, noted to have bilateral pleural effusion, BNP 1400, troponin 70, EKGs noted sinus tachycardia  Subjective: -Feels better today, breathing is improving  Assessment and Plan:  Acute CHF, new diagnosis  - Echo pending at this time, continue IV Lasix  today -Add Aldactone -Dietitian consult  Elevated troponin likely from CHF.  Remains flat.  - Follow-up echo  Hypertension on amlodipine . -Diuretics as above, add Aldactone  Diabetes mellitus type 2  -on Trulicity and Toujeo  26 units at bedtime. - A1c is 7.6  Hyperlipidemia on statins.  History of gout on allopurinol .  Obesity, BMI is 39.2 Sleep apnea  -not been compliant with his CPAP. - Needs diet and lifestyle modification, counseled  Iron deficiency anemia - Add oral iron at discharge   DVT prophylaxis: lovenox  Code Status: Full Code Family Communication: dad at bedside Disposition Plan:   Objective: Vitals:   04/19/24 1901 04/19/24 2359 04/20/24 0526 04/20/24 0748  BP: (!) 151/103 (!) 150/102 136/87 (!) 129/91  Pulse: (!) 107 (!) 107 (!) 103 (!) 101  Resp: (!) 25  (!) 23   Temp: 98.4 F (36.9 C) 98 F (36.7 C) 98.2 F (36.8 C) 98.3 F (36.8 C)  TempSrc: Oral Oral Oral Oral  SpO2: 98% 92% 96% 98%  Weight:   131.4 kg   Height:        Intake/Output Summary (Last 24 hours) at 04/20/2024 1122 Last data filed at 04/19/2024 2359 Gross per 24 hour  Intake 240 ml  Output --  Net 240 ml   Filed Weights   04/19/24 1344 04/19/24 1857 04/20/24 0526  Weight: 136.1 kg 133.4 kg 131.4 kg    Examination:  General exam: Appears calm and comfortable  HEENT: Positive JVD Respiratory system: Clear Cardiovascular system: S1 & S2 heard, RRR.   Abd: nondistended, soft and nontender.Normal bowel sounds heard. Extremities:+ edema Skin: No rashes Psychiatry:  Mood & affect appropriate.     Data Reviewed:   CBC: Recent Labs  Lab 04/19/24 1438 04/19/24 2248 04/20/24 0240  WBC 4.8 4.7 3.9*  NEUTROABS 3.1  --  2.2  HGB 12.3* 12.6* 12.3*  HCT 35.8* 37.5* 36.8*  MCV 75.2* 75.6* 76.0*  PLT 331 366 349   Basic Metabolic Panel: Recent Labs  Lab 04/19/24 1438 04/19/24 2248 04/20/24 0240  NA 143  --  142  K 3.7  --  3.9  CL 107  --  105  CO2 24  --  29  GLUCOSE 136*  --  132*  BUN 13  --  10  CREATININE 0.84 0.82 0.90  CALCIUM  9.1  --  8.7*  MG  --  1.4*  --    GFR: Estimated Creatinine Clearance: 156 mL/min (by C-G formula based on SCr of 0.9 mg/dL). Liver Function Tests: No results for input(s): "AST", "ALT", "ALKPHOS", "BILITOT", "PROT", "ALBUMIN" in the last 168 hours. No results for input(s): "LIPASE", "AMYLASE" in the last 168 hours. No results for input(s): "AMMONIA" in the last 168 hours. Coagulation Profile: No results for input(s): "INR", "PROTIME" in the last 168 hours. Cardiac Enzymes: No results for input(s): "CKTOTAL", "CKMB", "CKMBINDEX", "TROPONINI" in the last 168 hours. BNP (last 3 results) Recent Labs    04/19/24 1438  PROBNP 1,470.0*  HbA1C: Recent Labs    04/19/24 2248  HGBA1C 7.6*   CBG: Recent Labs  Lab 04/19/24 2256 04/20/24 0555  GLUCAP 129* 123*   Lipid Profile: No results for input(s): "CHOL", "HDL", "LDLCALC", "TRIG", "CHOLHDL", "LDLDIRECT" in the last 72 hours. Thyroid Function Tests: Recent Labs    04/19/24 2248  TSH 1.316   Anemia Panel: Recent Labs    04/19/24 2248  VITAMINB12 1,208*  FOLATE 9.5  FERRITIN 328  TIBC 302  IRON 26*  RETICCTPCT 0.7   Urine analysis:    Component Value Date/Time   COLORURINE COLORLESS (A) 10/06/2023 2013   APPEARANCEUR CLEAR 10/06/2023 2013   LABSPEC 1.025 10/06/2023 2013   PHURINE 5.5 10/06/2023 2013   GLUCOSEU  >1,000 (A) 10/06/2023 2013   HGBUR NEGATIVE 10/06/2023 2013   BILIRUBINUR NEGATIVE 10/06/2023 2013   KETONESUR NEGATIVE 10/06/2023 2013   PROTEINUR TRACE (A) 10/06/2023 2013   NITRITE NEGATIVE 10/06/2023 2013   LEUKOCYTESUR NEGATIVE 10/06/2023 2013   Sepsis Labs: @LABRCNTIP (procalcitonin:4,lacticidven:4)  )No results found for this or any previous visit (from the past 240 hours).   Radiology Studies: DG Chest 2 View Result Date: 04/19/2024 CLINICAL DATA:  Shortness of breath EXAM: CHEST - 2 VIEW COMPARISON:  Chest radiograph dated 07/08/2018 FINDINGS: Normal lung volumes. Left basilar patchy opacities. Trace blunting of bilateral costophrenic angles. No pneumothorax. Similar enlarged cardiomediastinal silhouette. No acute osseous abnormality. IMPRESSION: 1. Left basilar patchy opacities, which may represent atelectasis, aspiration, or pneumonia. 2. Trace blunting of bilateral costophrenic angles, which may represent trace pleural effusions. 3. Similar cardiomegaly. Electronically Signed   By: Limin  Xu M.D.   On: 04/19/2024 15:16     Scheduled Meds:  allopurinol   300 mg Oral Daily   amLODipine   10 mg Oral Daily   enoxaparin  (LOVENOX ) injection  60 mg Subcutaneous Q24H   furosemide   40 mg Intravenous Q12H   insulin  aspart  0-9 Units Subcutaneous TID WC   insulin  glargine-yfgn  26 Units Subcutaneous QHS   rosuvastatin   20 mg Oral QHS   Continuous Infusions:   LOS: 0 days    Time spent:    Deforest Fast, MD [facility]   04/20/2024, 11:22 AM

## 2024-04-20 NOTE — Plan of Care (Signed)

## 2024-04-20 NOTE — TOC CM/SW Note (Addendum)
 Transition of Care [facility]) - Inpatient Brief Assessment   Patient Details  Name: Christopher Hays MRN: [id number] Date of Birth: [birth year]  [facility] CM/SW Contact:    Jennett Model, RN Phone Number: 04/20/2024, 2:20 PM   Clinical Narrative: From home with spouse, has PCP , [facility] [employer] and insurance on file, states has no HH services in place at this time , has cpap machine at home.  States family member will transport them home at [employer] and family is support system, states [facility] on [address] and [address].  Pta self ambulatory .  Patient gives this NCM permission to speak with wife.    Transition of Care Asessment: Insurance and Status: Insurance coverage has been reviewed Patient has primary care physician: Yes Home environment has been reviewed: home with wife Prior level of function:: indep Prior/Current Home Services: Current home services (cpap machine, he states he will call company to get it updated) Social Drivers of Health Review: SDOH reviewed no interventions necessary Readmission risk has been reviewed: Yes Transition of care needs: no transition of care needs at this time

## 2024-04-20 NOTE — Telephone Encounter (Signed)
 Patient Product/process development scientist completed.    The patient is insured through [facility] and [facility].     Ran test claim for Entresto 24-26 mg and the current 30 day co-pay is $4.00.  Ran test claim for Farxgia 10 mg and the current 30 day co-pay is $4.00  Ran test claim for Jardiance 10 mg and the current 30 day co-pay is $4.00  This test claim was processed through [employer]- copay amounts may vary at other pharmacies due to [employer], or as the patient moves through the different stages of their insurance plan.     Morgan Arab, CPHT Pharmacy Technician III Certified Patient Advocate [facility] Patient Advocate Team Direct Number: [phone]  Fax: [phone]

## 2024-04-20 NOTE — Interval H&P Note (Signed)
 History and Physical Interval Note:  04/20/2024 6:57 PM  Christopher Hays  has presented today for surgery, with the diagnosis of Heart Failure.  The various methods of treatment have been discussed with the patient and family. After consideration of risks, benefits and other options for treatment, the patient has consented to  Procedure(s): RIGHT/LEFT HEART CATH AND CORONARY ANGIOGRAPHY (N/A) as a surgical intervention.  The patient's history has been reviewed, patient examined, no change in status, stable for surgery.  I have reviewed the patient's chart and labs.  Questions were answered to the patient's satisfaction.     [name]

## 2024-04-20 NOTE — H&P (View-Only) (Signed)
 Advanced Heart Failure Team Consult Note   Primary Physician: [facility] Cardiologist:  None  Reason for Consultation: acute systolic heart failure (new)   HPI:    Christopher Hays is seen today for evaluation of acute systolic heart failure at the request of Dr. Drexel Gentles, Internal Medicine   38 y/o male w/ h/o type 2DM, HTN, HLD, and untreated OSA admitted w/ new systolic heart failure.  Developed dry cough ~2 months ago. Initially treated for bronchitis w/o improvement.   About 1 month ago, developed LEE and exertional dyspnea, progressing to dyspnea at rest + orthopnea/PND at night. Also had chest tightness night prior to coming to ED.   In ED, found to be in acute CHF and hypertensive.  BPs 150s/low 100s. BNP 1,470. Hs trop 70>>70. EKG ST 103 bpm. No ischemic abnormalities. CXR w/ trace b/l pleural effusions.   HIV NR Hgb A1c 7.6   SCr 0.84 K 3.7 Mg 1.4  Labs c/w IDA. Hgb 12.3, MCV low 75, T sat 9%   Echo showed EF 30-35%, Global HK, mod LVH, normal RV, mod pericardial effusion, no significant valvular dz.   He has been admitted and started on IV Lasix . I/Os not recorded but he reports good UOP. Breathing improved. Not SOB at rest but still SOB w/ exertion. C/w LEE though he reports subjective improvement.   Denies drug use. No known family h/o CHF/SCD. Stopped using CPAP ~ 1 year ago due to intolerance to face mask.    Echo 04/20/24  1. Left ventricular ejection fraction, by estimation, is 30 to 35%. The  left ventricle has moderately decreased function. The left ventricle  demonstrates global hypokinesis. The left ventricular internal cavity size  was moderately dilated. There is  moderate left ventricular hypertrophy. Left ventricular diastolic  parameters were normal.   2. Right ventricular systolic function is normal. The right ventricular  size is normal.   3. Effusion largest posteriorly . Moderate pericardial effusion. The  pericardial  effusion is posterior to the left ventricle and anterior to  the right ventricle.   4. The mitral valve is abnormal. Mild mitral valve regurgitation. No  evidence of mitral stenosis.   5. The aortic valve is tricuspid. Aortic valve regurgitation is not  visualized. No aortic stenosis is present.   6. The inferior vena cava is normal in size with greater than 50%  respiratory variability, suggesting right atrial pressure of 3 mmHg.      Home Medications Prior to Admission medications   Medication Sig Start Date End Date Taking? Authorizing Provider  allopurinol  (ZYLOPRIM ) 100 MG tablet Take 100 mg by mouth daily. 06/29/18  Yes [provider]  amLODipine  (NORVASC ) 10 MG tablet Take 10 mg by mouth daily. 07/23/20  Yes [provider]  Dulaglutide (TRULICITY) 1.5 MG/0.5ML SOAJ Inject 1.5 mg into the skin once a week. 10/07/23  Yes Charmayne Cooper, MD  rosuvastatin  (CRESTOR ) 20 MG tablet Take 20 mg by mouth at bedtime. 07/23/20  Yes [provider]  TOUJEO  SOLOSTAR 300 UNIT/ML Solostar Pen Inject 25 Units into the skin at bedtime. Patient taking differently: Inject 26 Units into the skin at bedtime. 10/07/23  Yes Charmayne Cooper, MD  VITAMIN D PO Take 1 tablet by mouth daily.   Yes [provider]  albuterol (VENTOLIN HFA) 108 (90 Base) MCG/ACT inhaler Inhale 2 puffs into the lungs every 6 (six) hours as needed for shortness of breath. Patient not taking: Reported on 04/20/2024 02/09/24  03/10/24  [provider]  blood glucose meter kit and supplies Dispense based on patient and insurance preference. Use up to four times daily as directed. (FOR ICD-10 E10.9, E11.9). 08/03/19   Mikhail, Maryann, DO  Blood Glucose Monitoring Suppl DEVI 1 each by Does not apply route in the morning, at noon, and at bedtime. May substitute to any manufacturer covered by patient's insurance. 10/07/23   Charmayne Cooper, MD  Insulin  Pen Needle 31G X 5 MM MISC Use with  insulin  pen 08/03/19   Mikhail, Maryann, DO  prochlorperazine  (COMPAZINE ) 10 MG tablet Take 1 tablet (10 mg total) by mouth every 8 (eight) hours as needed for nausea or vomiting. Patient not taking: Reported on 04/20/2024 02/23/24   Tegeler, Marine Sia, MD    Past Medical History: Past Medical History:  Diagnosis Date   Diabetes mellitus without complication (HCC)    Gout    Hypertension    Sleep apnea     Past Surgical History: History reviewed. No pertinent surgical history.  Family History: Family History  Problem Relation Age of Onset   Gout Paternal Grandmother    Gout Paternal Grandfather    Diabetes Neg Hx     Social History: Social History   Socioeconomic History   Marital status: Married    Spouse name: Not on file   Number of children: Not on file   Years of education: Not on file   Highest education level: Not on file  Occupational History   Occupation: unemployed due to covid  Tobacco Use   Smoking status: Never   Smokeless tobacco: Never  Vaping Use   Vaping status: Never Used  Substance and Sexual Activity   Alcohol use: Yes    Comment: occasional    Drug use: No   Sexual activity: Not on file  Other Topics Concern   Not on file  Social History Narrative   Not on file   Social Drivers of Health   Financial Resource Strain: High Risk (09/03/2021)   Received from [facility]   Overall Financial Resource Strain (CARDIA)    Difficulty of Paying Living Expenses: Very hard  Food Insecurity: Low Risk  (03/20/2024)   Received from [facility]   Hunger Vital Sign    Within the past 12 months, you worried that your food would run out before you got money to buy more: Not on file    Ran Out of Food in the Last Year: Never true  Transportation Needs: No Transportation Needs (03/20/2024)   Received from [facility]    In the past 12 months, has lack of reliable transportation kept you from medical appointments, meetings, work or from  getting things needed for daily living? : No  Physical Activity: Insufficiently Active (09/03/2021)   Received from [facility]   Exercise Vital Sign    Days of Exercise per Week: 3 days    Minutes of Exercise per Session: 30 min  Stress: Stress Concern Present (09/03/2021)   Received from [facility] - Occupational Stress Questionnaire    Feeling of Stress : Rather much  Social Connections: Unknown (04/19/2022)   Received from [facility]   Social Network    Social Network: Not on file    Allergies:  No Known Allergies  Objective:    Vital Signs:   Temp:  [98 F (36.7 C)-98.5 F (36.9 C)] 98.3 F (36.8 C) (05/09 1239) Pulse Rate:  [101-107]  107 (05/09 1239) Resp:  [20-25] 20 (05/09 1239) BP: (129-151)/(87-133) 140/100 (05/09 1239) SpO2:  [92 %-98 %] 95 % (05/09 1239) Weight:  [131.4 kg-133.4 kg] 131.4 kg (05/09 0526) Last BM Date : 04/19/24  Weight change: Filed Weights   04/19/24 1344 04/19/24 1857 04/20/24 0526  Weight: 136.1 kg 133.4 kg 131.4 kg    Intake/Output:   Intake/Output Summary (Last 24 hours) at 04/20/2024 1448 Last data filed at 04/19/2024 2359 Gross per 24 hour  Intake 240 ml  Output --  Net 240 ml      Physical Exam    General:  Well appearing, obese. No resp difficulty at rest  HEENT: normal Neck: supple. JVP 12-14 cm . Carotids 2+ bilat; no bruits. No lymphadenopathy or thyromegaly appreciated. Cor: PMI nondisplaced. Regular rate & rhythm. No rubs, gallops or murmurs. Lungs: decreased BS at the bases  Abdomen: soft, nontender, nondistended. No hepatosplenomegaly. No bruits or masses. Good bowel sounds. Extremities: no cyanosis, clubbing, rash, 1+ b/l ankle/pre-tibial edema Neuro: alert & orientedx3, cranial nerves grossly intact. moves all 4 extremities w/o difficulty. Affect pleasant   Telemetry   NSR 90s, personally reviewed   EKG    Sinus tach 103 bpm   Labs   Basic Metabolic  Panel: Recent Labs  Lab 04/19/24 1438 04/19/24 2248 04/20/24 0240  NA 143  --  142  K 3.7  --  3.9  CL 107  --  105  CO2 24  --  29  GLUCOSE 136*  --  132*  BUN 13  --  10  CREATININE 0.84 0.82 0.90  CALCIUM  9.1  --  8.7*  MG  --  1.4*  --     Liver Function Tests: No results for input(s): "AST", "ALT", "ALKPHOS", "BILITOT", "PROT", "ALBUMIN" in the last 168 hours. No results for input(s): "LIPASE", "AMYLASE" in the last 168 hours. No results for input(s): "AMMONIA" in the last 168 hours.  CBC: Recent Labs  Lab 04/19/24 1438 04/19/24 2248 04/20/24 0240  WBC 4.8 4.7 3.9*  NEUTROABS 3.1  --  2.2  HGB 12.3* 12.6* 12.3*  HCT 35.8* 37.5* 36.8*  MCV 75.2* 75.6* 76.0*  PLT 331 366 349    Cardiac Enzymes: No results for input(s): "CKTOTAL", "CKMB", "CKMBINDEX", "TROPONINI" in the last 168 hours.  BNP: BNP (last 3 results) No results for input(s): "BNP" in the last 8760 hours.  ProBNP (last 3 results) Recent Labs    04/19/24 1438  PROBNP 1,470.0*     CBG: Recent Labs  Lab 04/19/24 2256 04/20/24 0555 04/20/24 1218 04/20/24 1240  GLUCAP 129* 123* 133* 155*    Coagulation Studies: No results for input(s): "LABPROT", "INR" in the last 72 hours.   Imaging   ECHOCARDIOGRAM COMPLETE Result Date: 04/20/2024    ECHOCARDIOGRAM REPORT   Patient Name:   Christopher Hays Date of Exam: 04/20/2024 Medical Rec #:  [id number]  Height:       72.0 in Accession #:    [id number] Weight:       289.7 lb Date of Birth:  26-Apr-1986  BSA:          2.494 m Patient Age:    37 years   BP:           129/91 mmHg Patient Gender: M          HR:           96 bpm. Exam Location:  Inpatient Procedure: 2D Echo, Cardiac Doppler and Color Doppler (Both Spectral  and Color            Flow Doppler were utilized during procedure). Indications:    CHF  History:        Patient has no prior history of Echocardiogram examinations.  Sonographer:    Janette Medley Referring Phys: [id number] ARSHAD N KAKRAKANDY IMPRESSIONS   1. Left ventricular ejection fraction, by estimation, is 30 to 35%. The left ventricle has moderately decreased function. The left ventricle demonstrates global hypokinesis. The left ventricular internal cavity size was moderately dilated. There is moderate left ventricular hypertrophy. Left ventricular diastolic parameters were normal.  2. Right ventricular systolic function is normal. The right ventricular size is normal.  3. Effusion largest posteriorly . Moderate pericardial effusion. The pericardial effusion is posterior to the left ventricle and anterior to the right ventricle.  4. The mitral valve is abnormal. Mild mitral valve regurgitation. No evidence of mitral stenosis.  5. The aortic valve is tricuspid. Aortic valve regurgitation is not visualized. No aortic stenosis is present.  6. The inferior vena cava is normal in size with greater than 50% respiratory variability, suggesting right atrial pressure of 3 mmHg. FINDINGS  Left Ventricle: Left ventricular ejection fraction, by estimation, is 30 to 35%. The left ventricle has moderately decreased function. The left ventricle demonstrates global hypokinesis. Strain was performed and the global longitudinal strain is indeterminate. The left ventricular internal cavity size was moderately dilated. There is moderate left ventricular hypertrophy. Left ventricular diastolic parameters were normal. Right Ventricle: The right ventricular size is normal. No increase in right ventricular wall thickness. Right ventricular systolic function is normal. Left [facility]: Left atrial size was normal in size. Right [facility]: Right atrial size was normal in size. Pericardium: Effusion largest posteriorly. A moderately sized pericardial effusion is present. The pericardial effusion is posterior to the left ventricle and anterior to the right ventricle. Mitral Valve: The mitral valve is abnormal. There is moderate thickening of the mitral valve leaflet(s). Mild mitral valve  regurgitation. No evidence of mitral valve stenosis. Tricuspid Valve: The tricuspid valve is normal in structure. Tricuspid valve regurgitation is mild . No evidence of tricuspid stenosis. Aortic Valve: The aortic valve is tricuspid. Aortic valve regurgitation is not visualized. No aortic stenosis is present. Pulmonic Valve: The pulmonic valve was normal in structure. Pulmonic valve regurgitation is trivial. No evidence of pulmonic stenosis. Aorta: The aortic root is normal in size and structure. Venous: The inferior vena cava is normal in size with greater than 50% respiratory variability, suggesting right atrial pressure of 3 mmHg. IAS/Shunts: No atrial level shunt detected by color flow Doppler. Additional Comments: 3D was performed not requiring image post processing on an independent workstation and was indeterminate.  LEFT VENTRICLE PLAX 2D LVIDd:         5.20 cm   Diastology LVIDs:         3.60 cm   LV e' lateral: 7.83 cm/s LV PW:         1.50 cm LV IVS:        1.50 cm LVOT diam:     2.30 cm LV SV:         46 LV SV Index:   18 LVOT Area:     4.15 cm  RIGHT VENTRICLE             IVC RV S prime:     10.30 cm/s  IVC diam: 1.60 cm TAPSE (M-mode): 2.9 cm LEFT ATRIUM  Index        RIGHT ATRIUM           Index LA Vol (A2C):   65.4 ml 26.22 ml/m  RA Area:     20.00 cm LA Vol (A4C):   84.0 ml 33.68 ml/m  RA Volume:   54.30 ml  21.77 ml/m LA Biplane Vol: 74.8 ml 29.99 ml/m  AORTIC VALVE LVOT Vmax:   80.90 cm/s LVOT Vmean:  51.000 cm/s LVOT VTI:    0.111 m  AORTA Ao Root diam: 3.20 cm Ao Asc diam:  3.50 cm  SHUNTS Systemic VTI:  0.11 m Systemic Diam: 2.30 cm Janelle Mediate MD Electronically signed by Janelle Mediate MD Signature Date/Time: 04/20/2024/12:40:48 PM    Final    DG Chest 2 View Result Date: 04/19/2024 CLINICAL DATA:  Shortness of breath EXAM: CHEST - 2 VIEW COMPARISON:  Chest radiograph dated 07/08/2018 FINDINGS: Normal lung volumes. Left basilar patchy opacities. Trace blunting of bilateral  costophrenic angles. No pneumothorax. Similar enlarged cardiomediastinal silhouette. No acute osseous abnormality. IMPRESSION: 1. Left basilar patchy opacities, which may represent atelectasis, aspiration, or pneumonia. 2. Trace blunting of bilateral costophrenic angles, which may represent trace pleural effusions. 3. Similar cardiomegaly. Electronically Signed   By: Limin  Xu M.D.   On: 04/19/2024 15:16     Medications:     Current Medications:  allopurinol   300 mg Oral Daily   amLODipine   10 mg Oral Daily   enoxaparin  (LOVENOX ) injection  60 mg Subcutaneous Q24H   insulin  aspart  0-9 Units Subcutaneous TID WC   insulin  glargine-yfgn  26 Units Subcutaneous QHS   rosuvastatin   20 mg Oral QHS   spironolactone  12.5 mg Oral Daily    Infusions:     Patient Profile   38 y/o male w/ h/o type 2DM, HTN, HLD, and untreated OSA admitted w/ new systolic heart failure.  Assessment/Plan   1. Acute Systolic Heart Failure - New diagnosis, Echo EF 30-35%, globl HK, RV nl  - NYHA Class IIIb on admission. Reports subjective improvement w/ diuresis. Appears still volume up on exam. Plan RHC today to guide diuresis  - Etiology uncertain. 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 Plan LHC today  - If LHC unrevealing, plan cMRI  - Stop Amlodipine  to allow room to push HF GDMT  - Increase Spiro to 25 mg daily  - Start Entresto 49-51 mg bid  - SGLT2i prior to d/c    2. Hypertension  - improving, GDMT per above - needs to restart CPAP therapy for OSA, suspect contributing   3. Type 2DM - Hgb A1c 7.6 - diabetes management per IM - plan to start SGLT2i prior to d/c  - on statin   4. OSA - stopped CPAP ~1 year ago - stressed importance of resuming therapy - Body mass index is 39.29 kg/m.>>recommend GLP1. Outpatient referral to [facility]   5. IDA - Hgb 12, MCV 75, T sat 9% - needs IV Fe but would wait until after cMRI  - needs further investigation/w/u as  outpatient     Length of Stay: 0  Ruddy Corral, PA-C  04/20/2024, 2:48 PM    Advanced Heart Failure Team Pager [phone] (M-F; 7a - 5p)  Please contact [facility] for night-coverage after hours (4p -7a ) and weekends on [email or web address]   Patient seen and examined with the above-signed Advanced Practice Provider and/or Housestaff. I personally reviewed laboratory data, imaging studies and relevant notes. I independently examined  the patient and formulated the important aspects of the plan. I have edited the note to reflect any of my changes or salient points. I have personally discussed the plan with the patient and/or family.  38 y/o male with obesity, HTN, DM2, OSA admitted with new onset systolic HF and chest pressure.   EF 30-35% by echo. Hstrop 70   ECG sinus tach with anterior Qs.   General:  Sitting up inbed. No resp difficulty HEENT: normal Neck: supple. JVP hard to see. Carotids 2+ bilat; no bruits. No lymphadenopathy or thryomegaly appreciated. Cor: PMI nondisplaced. Regular tachy Lungs: clear Abdomen: obese soft, nontender, nondistended. No hepatosplenomegaly. No bruits or masses. Good bowel sounds. Extremities: no cyanosis, clubbing, rash, trace edema Neuro: alert & orientedx3, cranial nerves grossly intact. moves all 4 extremities w/o difficulty. Affect pleasant  He has new onset systolic HF and chest pressure with abnormal ECG.   Proceed with R/L cath +/- cMRI.   May be related to HTN but SBP not overly elevated.   Jules Oar, MD  6:56 PM

## 2024-04-20 NOTE — Progress Notes (Addendum)
 Advanced Heart Failure Team Consult Note   Primary Physician: [facility] Cardiologist:  None  Reason for Consultation: acute systolic heart failure (new)   HPI:    Christopher Hays is seen today for evaluation of acute systolic heart failure at the request of Dr. Drexel Gentles, Internal Medicine   38 y/o male w/ h/o type 2DM, HTN, HLD, and untreated OSA admitted w/ new systolic heart failure.  Developed dry cough ~2 months ago. Initially treated for bronchitis w/o improvement.   About 1 month ago, developed LEE and exertional dyspnea, progressing to dyspnea at rest + orthopnea/PND at night. Also had chest tightness night prior to coming to ED.   In ED, found to be in acute CHF and hypertensive.  BPs 150s/low 100s. BNP 1,470. Hs trop 70>>70. EKG ST 103 bpm. No ischemic abnormalities. CXR w/ trace b/l pleural effusions.   HIV NR Hgb A1c 7.6   SCr 0.84 K 3.7 Mg 1.4  Labs c/w IDA. Hgb 12.3, MCV low 75, T sat 9%   Echo showed EF 30-35%, Global HK, mod LVH, normal RV, mod pericardial effusion, no significant valvular dz.   He has been admitted and started on IV Lasix . I/Os not recorded but he reports good UOP. Breathing improved. Not SOB at rest but still SOB w/ exertion. C/w LEE though he reports subjective improvement.   Denies drug use. No known family h/o CHF/SCD. Stopped using CPAP ~ 1 year ago due to intolerance to face mask.    Echo 04/20/24  1. Left ventricular ejection fraction, by estimation, is 30 to 35%. The  left ventricle has moderately decreased function. The left ventricle  demonstrates global hypokinesis. The left ventricular internal cavity size  was moderately dilated. There is  moderate left ventricular hypertrophy. Left ventricular diastolic  parameters were normal.   2. Right ventricular systolic function is normal. The right ventricular  size is normal.   3. Effusion largest posteriorly . Moderate pericardial effusion. The  pericardial  effusion is posterior to the left ventricle and anterior to  the right ventricle.   4. The mitral valve is abnormal. Mild mitral valve regurgitation. No  evidence of mitral stenosis.   5. The aortic valve is tricuspid. Aortic valve regurgitation is not  visualized. No aortic stenosis is present.   6. The inferior vena cava is normal in size with greater than 50%  respiratory variability, suggesting right atrial pressure of 3 mmHg.      Home Medications Prior to Admission medications   Medication Sig Start Date End Date Taking? Authorizing Provider  allopurinol  (ZYLOPRIM ) 100 MG tablet Take 100 mg by mouth daily. 06/29/18  Yes [provider]  amLODipine  (NORVASC ) 10 MG tablet Take 10 mg by mouth daily. 07/23/20  Yes [provider]  Dulaglutide (TRULICITY) 1.5 MG/0.5ML SOAJ Inject 1.5 mg into the skin once a week. 10/07/23  Yes Charmayne Cooper, MD  rosuvastatin  (CRESTOR ) 20 MG tablet Take 20 mg by mouth at bedtime. 07/23/20  Yes [provider]  TOUJEO  SOLOSTAR 300 UNIT/ML Solostar Pen Inject 25 Units into the skin at bedtime. Patient taking differently: Inject 26 Units into the skin at bedtime. 10/07/23  Yes Charmayne Cooper, MD  VITAMIN D PO Take 1 tablet by mouth daily.   Yes [provider]  albuterol (VENTOLIN HFA) 108 (90 Base) MCG/ACT inhaler Inhale 2 puffs into the lungs every 6 (six) hours as needed for shortness of breath. Patient not taking: Reported on 04/20/2024 02/09/24  03/10/24  [provider]  blood glucose meter kit and supplies Dispense based on patient and insurance preference. Use up to four times daily as directed. (FOR ICD-10 E10.9, E11.9). 08/03/19   Mikhail, Maryann, DO  Blood Glucose Monitoring Suppl DEVI 1 each by Does not apply route in the morning, at noon, and at bedtime. May substitute to any manufacturer covered by patient's insurance. 10/07/23   Charmayne Cooper, MD  Insulin  Pen Needle 31G X 5 MM MISC Use with  insulin  pen 08/03/19   Mikhail, Maryann, DO  prochlorperazine  (COMPAZINE ) 10 MG tablet Take 1 tablet (10 mg total) by mouth every 8 (eight) hours as needed for nausea or vomiting. Patient not taking: Reported on 04/20/2024 02/23/24   Tegeler, Marine Sia, MD    Past Medical History: Past Medical History:  Diagnosis Date   Diabetes mellitus without complication (HCC)    Gout    Hypertension    Sleep apnea     Past Surgical History: History reviewed. No pertinent surgical history.  Family History: Family History  Problem Relation Age of Onset   Gout Paternal Grandmother    Gout Paternal Grandfather    Diabetes Neg Hx     Social History: Social History   Socioeconomic History   Marital status: Married    Spouse name: Not on file   Number of children: Not on file   Years of education: Not on file   Highest education level: Not on file  Occupational History   Occupation: unemployed due to covid  Tobacco Use   Smoking status: Never   Smokeless tobacco: Never  Vaping Use   Vaping status: Never Used  Substance and Sexual Activity   Alcohol use: Yes    Comment: occasional    Drug use: No   Sexual activity: Not on file  Other Topics Concern   Not on file  Social History Narrative   Not on file   Social Drivers of Health   Financial Resource Strain: High Risk (09/03/2021)   Received from [facility]   Overall Financial Resource Strain (CARDIA)    Difficulty of Paying Living Expenses: Very hard  Food Insecurity: Low Risk  (03/20/2024)   Received from [facility]   Hunger Vital Sign    Within the past 12 months, you worried that your food would run out before you got money to buy more: Not on file    Ran Out of Food in the Last Year: Never true  Transportation Needs: No Transportation Needs (03/20/2024)   Received from [facility]    In the past 12 months, has lack of reliable transportation kept you from medical appointments, meetings, work or from  getting things needed for daily living? : No  Physical Activity: Insufficiently Active (09/03/2021)   Received from [facility]   Exercise Vital Sign    Days of Exercise per Week: 3 days    Minutes of Exercise per Session: 30 min  Stress: Stress Concern Present (09/03/2021)   Received from [facility] - Occupational Stress Questionnaire    Feeling of Stress : Rather much  Social Connections: Unknown (04/19/2022)   Received from [facility]   Social Network    Social Network: Not on file    Allergies:  No Known Allergies  Objective:    Vital Signs:   Temp:  [98 F (36.7 C)-98.5 F (36.9 C)] 98.3 F (36.8 C) (05/09 1239) Pulse Rate:  [101-107]  107 (05/09 1239) Resp:  [20-25] 20 (05/09 1239) BP: (129-151)/(87-133) 140/100 (05/09 1239) SpO2:  [92 %-98 %] 95 % (05/09 1239) Weight:  [131.4 kg-133.4 kg] 131.4 kg (05/09 0526) Last BM Date : 04/19/24  Weight change: Filed Weights   04/19/24 1344 04/19/24 1857 04/20/24 0526  Weight: 136.1 kg 133.4 kg 131.4 kg    Intake/Output:   Intake/Output Summary (Last 24 hours) at 04/20/2024 1448 Last data filed at 04/19/2024 2359 Gross per 24 hour  Intake 240 ml  Output --  Net 240 ml      Physical Exam    General:  Well appearing, obese. No resp difficulty at rest  HEENT: normal Neck: supple. JVP 12-14 cm . Carotids 2+ bilat; no bruits. No lymphadenopathy or thyromegaly appreciated. Cor: PMI nondisplaced. Regular rate & rhythm. No rubs, gallops or murmurs. Lungs: decreased BS at the bases  Abdomen: soft, nontender, nondistended. No hepatosplenomegaly. No bruits or masses. Good bowel sounds. Extremities: no cyanosis, clubbing, rash, 1+ b/l ankle/pre-tibial edema Neuro: alert & orientedx3, cranial nerves grossly intact. moves all 4 extremities w/o difficulty. Affect pleasant   Telemetry   NSR 90s, personally reviewed   EKG    Sinus tach 103 bpm   Labs   Basic Metabolic  Panel: Recent Labs  Lab 04/19/24 1438 04/19/24 2248 04/20/24 0240  NA 143  --  142  K 3.7  --  3.9  CL 107  --  105  CO2 24  --  29  GLUCOSE 136*  --  132*  BUN 13  --  10  CREATININE 0.84 0.82 0.90  CALCIUM  9.1  --  8.7*  MG  --  1.4*  --     Liver Function Tests: No results for input(s): "AST", "ALT", "ALKPHOS", "BILITOT", "PROT", "ALBUMIN" in the last 168 hours. No results for input(s): "LIPASE", "AMYLASE" in the last 168 hours. No results for input(s): "AMMONIA" in the last 168 hours.  CBC: Recent Labs  Lab 04/19/24 1438 04/19/24 2248 04/20/24 0240  WBC 4.8 4.7 3.9*  NEUTROABS 3.1  --  2.2  HGB 12.3* 12.6* 12.3*  HCT 35.8* 37.5* 36.8*  MCV 75.2* 75.6* 76.0*  PLT 331 366 349    Cardiac Enzymes: No results for input(s): "CKTOTAL", "CKMB", "CKMBINDEX", "TROPONINI" in the last 168 hours.  BNP: BNP (last 3 results) No results for input(s): "BNP" in the last 8760 hours.  ProBNP (last 3 results) Recent Labs    04/19/24 1438  PROBNP 1,470.0*     CBG: Recent Labs  Lab 04/19/24 2256 04/20/24 0555 04/20/24 1218 04/20/24 1240  GLUCAP 129* 123* 133* 155*    Coagulation Studies: No results for input(s): "LABPROT", "INR" in the last 72 hours.   Imaging   ECHOCARDIOGRAM COMPLETE Result Date: 04/20/2024    ECHOCARDIOGRAM REPORT   Patient Name:   Christopher Hays Date of Exam: 04/20/2024 Medical Rec #:  [id number]  Height:       72.0 in Accession #:    [id number] Weight:       289.7 lb Date of Birth:  26-Apr-1986  BSA:          2.494 m Patient Age:    37 years   BP:           129/91 mmHg Patient Gender: M          HR:           96 bpm. Exam Location:  Inpatient Procedure: 2D Echo, Cardiac Doppler and Color Doppler (Both Spectral  and Color            Flow Doppler were utilized during procedure). Indications:    CHF  History:        Patient has no prior history of Echocardiogram examinations.  Sonographer:    Janette Medley Referring Phys: [id number] ARSHAD N KAKRAKANDY IMPRESSIONS   1. Left ventricular ejection fraction, by estimation, is 30 to 35%. The left ventricle has moderately decreased function. The left ventricle demonstrates global hypokinesis. The left ventricular internal cavity size was moderately dilated. There is moderate left ventricular hypertrophy. Left ventricular diastolic parameters were normal.  2. Right ventricular systolic function is normal. The right ventricular size is normal.  3. Effusion largest posteriorly . Moderate pericardial effusion. The pericardial effusion is posterior to the left ventricle and anterior to the right ventricle.  4. The mitral valve is abnormal. Mild mitral valve regurgitation. No evidence of mitral stenosis.  5. The aortic valve is tricuspid. Aortic valve regurgitation is not visualized. No aortic stenosis is present.  6. The inferior vena cava is normal in size with greater than 50% respiratory variability, suggesting right atrial pressure of 3 mmHg. FINDINGS  Left Ventricle: Left ventricular ejection fraction, by estimation, is 30 to 35%. The left ventricle has moderately decreased function. The left ventricle demonstrates global hypokinesis. Strain was performed and the global longitudinal strain is indeterminate. The left ventricular internal cavity size was moderately dilated. There is moderate left ventricular hypertrophy. Left ventricular diastolic parameters were normal. Right Ventricle: The right ventricular size is normal. No increase in right ventricular wall thickness. Right ventricular systolic function is normal. Left Atrium: Left atrial size was normal in size. Right [facility]: Right atrial size was normal in size. Pericardium: Effusion largest posteriorly. A moderately sized pericardial effusion is present. The pericardial effusion is posterior to the left ventricle and anterior to the right ventricle. Mitral Valve: The mitral valve is abnormal. There is moderate thickening of the mitral valve leaflet(s). Mild mitral valve  regurgitation. No evidence of mitral valve stenosis. Tricuspid Valve: The tricuspid valve is normal in structure. Tricuspid valve regurgitation is mild . No evidence of tricuspid stenosis. Aortic Valve: The aortic valve is tricuspid. Aortic valve regurgitation is not visualized. No aortic stenosis is present. Pulmonic Valve: The pulmonic valve was normal in structure. Pulmonic valve regurgitation is trivial. No evidence of pulmonic stenosis. Aorta: The aortic root is normal in size and structure. Venous: The inferior vena cava is normal in size with greater than 50% respiratory variability, suggesting right atrial pressure of 3 mmHg. IAS/Shunts: No atrial level shunt detected by color flow Doppler. Additional Comments: 3D was performed not requiring image post processing on an independent workstation and was indeterminate.  LEFT VENTRICLE PLAX 2D LVIDd:         5.20 cm   Diastology LVIDs:         3.60 cm   LV e' lateral: 7.83 cm/s LV PW:         1.50 cm LV IVS:        1.50 cm LVOT diam:     2.30 cm LV SV:         46 LV SV Index:   18 LVOT Area:     4.15 cm  RIGHT VENTRICLE             IVC RV S prime:     10.30 cm/s  IVC diam: 1.60 cm TAPSE (M-mode): 2.9 cm LEFT ATRIUM  Index        RIGHT ATRIUM           Index LA Vol (A2C):   65.4 ml 26.22 ml/m  RA Area:     20.00 cm LA Vol (A4C):   84.0 ml 33.68 ml/m  RA Volume:   54.30 ml  21.77 ml/m LA Biplane Vol: 74.8 ml 29.99 ml/m  AORTIC VALVE LVOT Vmax:   80.90 cm/s LVOT Vmean:  51.000 cm/s LVOT VTI:    0.111 m  AORTA Ao Root diam: 3.20 cm Ao Asc diam:  3.50 cm  SHUNTS Systemic VTI:  0.11 m Systemic Diam: 2.30 cm Janelle Mediate MD Electronically signed by Janelle Mediate MD Signature Date/Time: 04/20/2024/12:40:48 PM    Final    DG Chest 2 View Result Date: 04/19/2024 CLINICAL DATA:  Shortness of breath EXAM: CHEST - 2 VIEW COMPARISON:  Chest radiograph dated 07/08/2018 FINDINGS: Normal lung volumes. Left basilar patchy opacities. Trace blunting of bilateral  costophrenic angles. No pneumothorax. Similar enlarged cardiomediastinal silhouette. No acute osseous abnormality. IMPRESSION: 1. Left basilar patchy opacities, which may represent atelectasis, aspiration, or pneumonia. 2. Trace blunting of bilateral costophrenic angles, which may represent trace pleural effusions. 3. Similar cardiomegaly. Electronically Signed   By: Limin  Xu M.D.   On: 04/19/2024 15:16     Medications:     Current Medications:  allopurinol   300 mg Oral Daily   amLODipine   10 mg Oral Daily   enoxaparin  (LOVENOX ) injection  60 mg Subcutaneous Q24H   insulin  aspart  0-9 Units Subcutaneous TID WC   insulin  glargine-yfgn  26 Units Subcutaneous QHS   rosuvastatin   20 mg Oral QHS   spironolactone  12.5 mg Oral Daily    Infusions:     Patient Profile   38 y/o male w/ h/o type 2DM, HTN, HLD, and untreated OSA admitted w/ new systolic heart failure.  Assessment/Plan   1. Acute Systolic Heart Failure - New diagnosis, Echo EF 30-35%, globl HK, RV nl  - NYHA Class IIIb on admission. Reports subjective improvement w/ diuresis. Appears still volume up on exam. Plan RHC today to guide diuresis  - Etiology uncertain. 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 Plan LHC today  - If LHC unrevealing, plan cMRI  - Stop Amlodipine  to allow room to push HF GDMT  - Increase Spiro to 25 mg daily  - Start Entresto 49-51 mg bid  - SGLT2i prior to d/c    2. Hypertension  - improving, GDMT per above - needs to restart CPAP therapy for OSA, suspect contributing   3. Type 2DM - Hgb A1c 7.6 - diabetes management per IM - plan to start SGLT2i prior to d/c  - on statin   4. OSA - stopped CPAP ~1 year ago - stressed importance of resuming therapy - Body mass index is 39.29 kg/m.>>recommend GLP1. Outpatient referral to [facility]   5. IDA - Hgb 12, MCV 75, T sat 9% - needs IV Fe but would wait until after cMRI  - needs further investigation/w/u as  outpatient     Length of Stay: 0  Ruddy Corral, PA-C  04/20/2024, 2:48 PM    Advanced Heart Failure Team Pager [phone] (M-F; 7a - 5p)  Please contact [facility] for night-coverage after hours (4p -7a ) and weekends on [email or web address]   Patient seen and examined with the above-signed Advanced Practice Provider and/or Housestaff. I personally reviewed laboratory data, imaging studies and relevant notes. I independently examined  the patient and formulated the important aspects of the plan. I have edited the note to reflect any of my changes or salient points. I have personally discussed the plan with the patient and/or family.  38 y/o male with obesity, HTN, DM2, OSA admitted with new onset systolic HF and chest pressure.   EF 30-35% by echo. Hstrop 70   ECG sinus tach with anterior Qs.   General:  Sitting up inbed. No resp difficulty HEENT: normal Neck: supple. JVP hard to see. Carotids 2+ bilat; no bruits. No lymphadenopathy or thryomegaly appreciated. Cor: PMI nondisplaced. Regular tachy Lungs: clear Abdomen: obese soft, nontender, nondistended. No hepatosplenomegaly. No bruits or masses. Good bowel sounds. Extremities: no cyanosis, clubbing, rash, trace edema Neuro: alert & orientedx3, cranial nerves grossly intact. moves all 4 extremities w/o difficulty. Affect pleasant  He has new onset systolic HF and chest pressure with abnormal ECG.   Proceed with R/L cath +/- cMRI.   May be related to HTN but SBP not overly elevated.   Jules Oar, MD  6:56 PM

## 2024-04-21 ENCOUNTER — Other Ambulatory Visit (HOSPITAL_COMMUNITY): Payer: Self-pay

## 2024-04-21 ENCOUNTER — Encounter (HOSPITAL_COMMUNITY): Payer: Self-pay | Admitting: Internal Medicine

## 2024-04-21 DIAGNOSIS — E782 Mixed hyperlipidemia: Secondary | ICD-10-CM

## 2024-04-21 DIAGNOSIS — I5021 Acute systolic (congestive) heart failure: Secondary | ICD-10-CM

## 2024-04-21 DIAGNOSIS — E66813 Obesity, class 3: Secondary | ICD-10-CM

## 2024-04-21 DIAGNOSIS — G4733 Obstructive sleep apnea (adult) (pediatric): Secondary | ICD-10-CM

## 2024-04-21 LAB — BASIC METABOLIC PANEL WITH GFR
Anion gap: 13 (ref 5–15)
BUN: 11 mg/dL (ref 6–20)
CO2: 27 mmol/L (ref 22–32)
Calcium: 8.9 mg/dL (ref 8.9–10.3)
Chloride: 103 mmol/L (ref 98–111)
Creatinine, Ser: 0.84 mg/dL (ref 0.61–1.24)
GFR, Estimated: >60 mL/min (ref >60)
Glucose, Bld: 96 mg/dL (ref 70–99)
Potassium: 3.2 mmol/L — ABNORMAL LOW (ref 3.5–5.1)
Sodium: 143 mmol/L (ref 135–145)

## 2024-04-21 LAB — GLUCOSE, CAPILLARY
Glucose-Capillary: 97 mg/dL (ref 70–99)
Glucose-Capillary: 122 mg/dL — ABNORMAL HIGH (ref 70–99)
Glucose-Capillary: 109 mg/dL — ABNORMAL HIGH (ref 70–99)
Glucose-Capillary: 119 mg/dL — ABNORMAL HIGH (ref 70–99)

## 2024-04-21 MED ORDER — EMPAGLIFLOZIN 10 MG PO TABS
10.0000 mg | ORAL_TABLET | Freq: Every day | ORAL | Status: DC
  Administered 2024-04-21 – 2024-04-23 (×3): 10 mg via ORAL
  Filled 2024-04-21 (×3): qty 1

## 2024-04-21 MED ORDER — POTASSIUM CHLORIDE CRYS ER 20 MEQ PO TBCR
40.0000 meq | EXTENDED_RELEASE_TABLET | Freq: Once | ORAL | Status: AC
  Administered 2024-04-21: 40 meq via ORAL
  Filled 2024-04-21: qty 2

## 2024-04-21 MED ORDER — COLCHICINE 0.6 MG PO TABS
0.6000 mg | ORAL_TABLET | Freq: Two times a day (BID) | ORAL | Status: AC
Start: 2024-04-21 — End: 2024-04-21
  Administered 2024-04-21 (×2): 0.6 mg via ORAL
  Filled 2024-04-21 (×2): qty 1

## 2024-04-21 MED ORDER — FUROSEMIDE 10 MG/ML IJ SOLN
60.0000 mg | Freq: Two times a day (BID) | INTRAMUSCULAR | Status: DC
  Administered 2024-04-21: 60 mg via INTRAVENOUS
  Filled 2024-04-21: qty 6

## 2024-04-21 NOTE — Plan of Care (Signed)
  Problem: Clinical Measurements: Goal: Will remain free from infection Outcome: Progressing Goal: Respiratory complications will improve Outcome: Progressing Goal: Cardiovascular complication will be avoided Outcome: Progressing   Problem: Activity: Goal: Risk for activity intolerance will decrease Outcome: Progressing   Problem: Coping: Goal: Level of anxiety will decrease Outcome: Progressing   Problem: Elimination: Goal: Will not experience complications related to urinary retention Outcome: Progressing   Problem: Safety: Goal: Ability to remain free from injury will improve Outcome: Progressing   Problem: Skin Integrity: Goal: Risk for impaired skin integrity will decrease Outcome: Progressing   Problem: Tissue Perfusion: Goal: Adequacy of tissue perfusion will improve Outcome: Progressing   Problem: Education: Goal: Understanding of CV disease, CV risk reduction, and recovery process will improve Outcome: Progressing   Problem: Cardiovascular: Goal: Vascular access site(s) Level 0-1 will be maintained Outcome: Progressing   Problem: Cardiac: Goal: Ability to achieve and maintain adequate cardiopulmonary perfusion will improve Outcome: Progressing

## 2024-04-21 NOTE — Progress Notes (Signed)
 PROGRESS NOTE    Christopher Hays  ZOX:[id number] DOB: Dec 11, 1986 DOA: 04/19/2024 PCP: [facility]   37/M wDM 2, hypertension, hyperlipidemia, sleep apnea, gout presented to the ED with edema, dyspnea, cough over the last few months followed by orthopnea. -In the ED, noted to have bilateral pleural effusion, BNP 1400, troponin 70, EKGs noted sinus tachycardia  Subjective: -Feels better today, breathing is improving  Assessment and Plan:  Acute systolic CHF, new diagnosis -Echo noted EF of 30-35%, normal RV, global hypokinesis -R/LHC Yesterday with Mild Nonobstructive CAD, Elevated Filling Pressures wedge 22, EF less than 20% - Started on Entresto yesterday, Aldactone -Starting Jardiance today - Likely transition to oral diuretics tomorrow  -dietitian consulted  Moderate pericardial effusion - Etiology she is unclear, ?viral - Check ANA, ESR  Elevated troponin likely from CHF.  Remains flat.  - Cath with mild nonobstructive CAD  Hypertension on amlodipine . -Diuretics as above, add Aldactone  Diabetes mellitus type 2  -on Trulicity and Toujeo  26 units at bedtime. - A1c is 7.6  Hyperlipidemia on statins.  History of gout on allopurinol .  Obesity, BMI is 39.2 Sleep apnea  -not been compliant with his CPAP. - Needs diet and lifestyle modification, counseled  Iron deficiency anemia - Add oral iron at discharge - IV iron after cardiac MRI   DVT prophylaxis: lovenox  Code Status: Full Code Family Communication: dad at bedside Disposition Plan:   Objective: Vitals:   04/21/24 0030 04/21/24 0428 04/21/24 0611 04/21/24 0802  BP: 126/89 131/86  125/84  Pulse: 100   97  Resp: 17   18  Temp: 98.1 F (36.7 C) (!) 97.5 F (36.4 C)  98.5 F (36.9 C)  TempSrc: Oral Oral  Oral  SpO2: 95%   96%  Weight:   125.6 kg   Height:   6' (1.829 m)     Intake/Output Summary (Last 24 hours) at 04/21/2024 1149 Last data filed at 04/21/2024 0804 Gross per 24 hour   Intake 1200 ml  Output 1480 ml  Net -280 ml   Filed Weights   04/19/24 1857 04/20/24 0526 04/21/24 0611  Weight: 133.4 kg 131.4 kg 125.6 kg    Examination:  General exam: Appears calm and comfortable  HEENT: Positive JVD Respiratory system: Clear Cardiovascular system: S1 & S2 heard, RRR.  Abd: nondistended, soft and nontender.Normal bowel sounds heard. Extremities:+ edema Skin: No rashes Psychiatry:  Mood & affect appropriate.     Data Reviewed:   CBC: Recent Labs  Lab 04/19/24 1438 04/19/24 2248 04/20/24 0240 04/20/24 1922 04/20/24 1925 04/20/24 2307  WBC 4.8 4.7 3.9*  --   --  5.4  NEUTROABS 3.1  --  2.2  --   --   --   HGB 12.3* 12.6* 12.3* 11.2* 11.9*  11.9* 12.8*  HCT 35.8* 37.5* 36.8* 33.0* 35.0*  35.0* 37.5*  MCV 75.2* 75.6* 76.0*  --   --  74.3*  PLT 331 366 349  --   --  366   Basic Metabolic Panel: Recent Labs  Lab 04/19/24 1438 04/19/24 2248 04/20/24 0240 04/20/24 1922 04/20/24 1925 04/20/24 2307 04/21/24 0321  NA 143  --  142 146* 144  144  --  143  K 3.7  --  3.9 2.9* 3.1*  3.1*  --  3.2*  CL 107  --  105  --   --   --  103  CO2 24  --  29  --   --   --  27  GLUCOSE 136*  --  132*  --   --   --  96  BUN 13  --  10  --   --   --  11  CREATININE 0.84 0.82 0.90  --   --  0.91 0.84  CALCIUM  9.1  --  8.7*  --   --   --  8.9  MG  --  1.4*  --   --   --   --   --    GFR: Estimated Creatinine Clearance: 163.3 mL/min (by C-G formula based on SCr of 0.84 mg/dL). Liver Function Tests: No results for input(s): "AST", "ALT", "ALKPHOS", "BILITOT", "PROT", "ALBUMIN" in the last 168 hours. No results for input(s): "LIPASE", "AMYLASE" in the last 168 hours. No results for input(s): "AMMONIA" in the last 168 hours. Coagulation Profile: No results for input(s): "INR", "PROTIME" in the last 168 hours. Cardiac Enzymes: No results for input(s): "CKTOTAL", "CKMB", "CKMBINDEX", "TROPONINI" in the last 168 hours. BNP (last 3 results) Recent Labs     04/19/24 1438  PROBNP 1,470.0*   HbA1C: Recent Labs    04/19/24 2248  HGBA1C 7.6*   CBG: Recent Labs  Lab 04/20/24 1536 04/20/24 2003 04/20/24 2120 04/21/24 0613 04/21/24 1120  GLUCAP 112* 88 131* 97 122*   Lipid Profile: No results for input(s): "CHOL", "HDL", "LDLCALC", "TRIG", "CHOLHDL", "LDLDIRECT" in the last 72 hours. Thyroid Function Tests: Recent Labs    04/19/24 2248  TSH 1.316   Anemia Panel: Recent Labs    04/19/24 2248  VITAMINB12 1,208*  FOLATE 9.5  FERRITIN 328  TIBC 302  IRON 26*  RETICCTPCT 0.7   Urine analysis:    Component Value Date/Time   COLORURINE COLORLESS (A) 10/06/2023 2013   APPEARANCEUR CLEAR 10/06/2023 2013   LABSPEC 1.025 10/06/2023 2013   PHURINE 5.5 10/06/2023 2013   GLUCOSEU >1,000 (A) 10/06/2023 2013   HGBUR NEGATIVE 10/06/2023 2013   BILIRUBINUR NEGATIVE 10/06/2023 2013   KETONESUR NEGATIVE 10/06/2023 2013   PROTEINUR TRACE (A) 10/06/2023 2013   NITRITE NEGATIVE 10/06/2023 2013   LEUKOCYTESUR NEGATIVE 10/06/2023 2013   Sepsis Labs: @LABRCNTIP (procalcitonin:4,lacticidven:4)  )No results found for this or any previous visit (from the past 240 hours).   Radiology Studies: CARDIAC CATHETERIZATION Result Date: 04/20/2024   Prox RCA lesion is 20% stenosed.   There is severe left ventricular systolic dysfunction.   The left ventricular ejection fraction is less than 25% by visual estimate. Findings: Ao = 129/95 (110) LV = 127/27 RA = 12 RV = 61/17 PA = 59/26 (40) PCW = 22 Fick cardiac output/index = 7.6/3.1 Thermo CO/CI = 6.5/2.6 PVR = 2.4 Fick 2.8 TD Ao sat = 94% PA sat = 70%, 66% PAPi = 2.8 Assessment: 1. Minimal CAD (20% RCA) 2. Severe NICM EF < 20% 3. Moderate mixed pulmonary HTN 4. Elevated filling pressures with normal cardiac output Plan/Discussion: Continue diuresis. Titrated GDMT. Plan cMRI. Jules Oar, MD 7:46 PM   ECHOCARDIOGRAM COMPLETE Result Date: 04/20/2024    ECHOCARDIOGRAM REPORT   Patient Name:   Christopher Hays Date of Exam: 04/20/2024 Medical Rec #:  [id number]  Height:       72.0 in Accession #:    [id number] Weight:       289.7 lb Date of Birth:  June 04, 1986  BSA:          2.494 m Patient Age:    37 years   BP:           129/91 mmHg  Patient Gender: M          HR:           96 bpm. Exam Location:  Inpatient Procedure: 2D Echo, Cardiac Doppler and Color Doppler (Both Spectral and Color            Flow Doppler were utilized during procedure). Indications:    CHF  History:        Patient has no prior history of Echocardiogram examinations.  Sonographer:    Janette Medley Referring Phys: 15 ARSHAD N KAKRAKANDY IMPRESSIONS  1. Left ventricular ejection fraction, by estimation, is 30 to 35%. The left ventricle has moderately decreased function. The left ventricle demonstrates global hypokinesis. The left ventricular internal cavity size was moderately dilated. There is moderate left ventricular hypertrophy. Left ventricular diastolic parameters were normal.  2. Right ventricular systolic function is normal. The right ventricular size is normal.  3. Effusion largest posteriorly . Moderate pericardial effusion. The pericardial effusion is posterior to the left ventricle and anterior to the right ventricle.  4. The mitral valve is abnormal. Mild mitral valve regurgitation. No evidence of mitral stenosis.  5. The aortic valve is tricuspid. Aortic valve regurgitation is not visualized. No aortic stenosis is present.  6. The inferior vena cava is normal in size with greater than 50% respiratory variability, suggesting right atrial pressure of 3 mmHg. FINDINGS  Left Ventricle: Left ventricular ejection fraction, by estimation, is 30 to 35%. The left ventricle has moderately decreased function. The left ventricle demonstrates global hypokinesis. Strain was performed and the global longitudinal strain is indeterminate. The left ventricular internal cavity size was moderately dilated. There is moderate left ventricular hypertrophy.  Left ventricular diastolic parameters were normal. Right Ventricle: The right ventricular size is normal. No increase in right ventricular wall thickness. Right ventricular systolic function is normal. Left Atrium: Left atrial size was normal in size. Right Atrium: Right atrial size was normal in size. Pericardium: Effusion largest posteriorly. A moderately sized pericardial effusion is present. The pericardial effusion is posterior to the left ventricle and anterior to the right ventricle. Mitral Valve: The mitral valve is abnormal. There is moderate thickening of the mitral valve leaflet(s). Mild mitral valve regurgitation. No evidence of mitral valve stenosis. Tricuspid Valve: The tricuspid valve is normal in structure. Tricuspid valve regurgitation is mild . No evidence of tricuspid stenosis. Aortic Valve: The aortic valve is tricuspid. Aortic valve regurgitation is not visualized. No aortic stenosis is present. Pulmonic Valve: The pulmonic valve was normal in structure. Pulmonic valve regurgitation is trivial. No evidence of pulmonic stenosis. Aorta: The aortic root is normal in size and structure. Venous: The inferior vena cava is normal in size with greater than 50% respiratory variability, suggesting right atrial pressure of 3 mmHg. IAS/Shunts: No atrial level shunt detected by color flow Doppler. Additional Comments: 3D was performed not requiring image post processing on an independent workstation and was indeterminate.  LEFT VENTRICLE PLAX 2D LVIDd:         5.20 cm   Diastology LVIDs:         3.60 cm   LV e' lateral: 7.83 cm/s LV PW:         1.50 cm LV IVS:        1.50 cm LVOT diam:     2.30 cm LV SV:         46 LV SV Index:   18 LVOT Area:     4.15 cm  RIGHT VENTRICLE  IVC RV S prime:     10.30 cm/s  IVC diam: 1.60 cm TAPSE (M-mode): 2.9 cm LEFT ATRIUM             Index        RIGHT ATRIUM           Index LA Vol (A2C):   65.4 ml 26.22 ml/m  RA Area:     20.00 cm LA Vol (A4C):   84.0 ml  33.68 ml/m  RA Volume:   54.30 ml  21.77 ml/m LA Biplane Vol: 74.8 ml 29.99 ml/m  AORTIC VALVE LVOT Vmax:   80.90 cm/s LVOT Vmean:  51.000 cm/s LVOT VTI:    0.111 m  AORTA Ao Root diam: 3.20 cm Ao Asc diam:  3.50 cm  SHUNTS Systemic VTI:  0.11 m Systemic Diam: 2.30 cm Janelle Mediate MD Electronically signed by Janelle Mediate MD Signature Date/Time: 04/20/2024/12:40:48 PM    Final    DG Chest 2 View Result Date: 04/19/2024 CLINICAL DATA:  Shortness of breath EXAM: CHEST - 2 VIEW COMPARISON:  Chest radiograph dated 07/08/2018 FINDINGS: Normal lung volumes. Left basilar patchy opacities. Trace blunting of bilateral costophrenic angles. No pneumothorax. Similar enlarged cardiomediastinal silhouette. No acute osseous abnormality. IMPRESSION: 1. Left basilar patchy opacities, which may represent atelectasis, aspiration, or pneumonia. 2. Trace blunting of bilateral costophrenic angles, which may represent trace pleural effusions. 3. Similar cardiomegaly. Electronically Signed   By: Limin  Xu M.D.   On: 04/19/2024 15:16     Scheduled Meds:  allopurinol   300 mg Oral Daily   colchicine   0.6 mg Oral BID   digoxin  0.125 mg Oral Daily   empagliflozin  10 mg Oral Daily   enoxaparin  (LOVENOX ) injection  60 mg Subcutaneous Q24H   furosemide   60 mg Intravenous BID   insulin  aspart  0-9 Units Subcutaneous TID WC   insulin  glargine-yfgn  26 Units Subcutaneous QHS   rosuvastatin   20 mg Oral QHS   sacubitril-valsartan  1 tablet Oral BID   sodium chloride  flush  3 mL Intravenous Q12H   spironolactone  25 mg Oral Daily   Continuous Infusions:  sodium chloride        LOS: 1 day    Time spent:    Deforest Fast, MD [facility]   04/21/2024, 11:49 AM

## 2024-04-21 NOTE — Progress Notes (Signed)
 Progress Note  Patient Name: Christopher Hays Date of Encounter: 04/21/2024 Primary Cardiologist: None   Subjective   Overnight found to have no ischemia .  Planned for aggressive IV diuresis Patient notes no issues. No CP, SOB, Palpitations.  Vital Signs    Vitals:   04/21/24 0030 04/21/24 0428 04/21/24 0611 04/21/24 0802  BP: 126/89 131/86  125/84  Pulse: 100   97  Resp: 17   18  Temp: 98.1 F (36.7 C) (!) 97.5 F (36.4 C)  98.5 F (36.9 C)  TempSrc: Oral Oral  Oral  SpO2: 95%   96%  Weight:   125.6 kg   Height:   6' (1.829 m)     Intake/Output Summary (Last 24 hours) at 04/21/2024 0943 Last data filed at 04/21/2024 0804 Gross per 24 hour  Intake 1200 ml  Output 1480 ml  Net -280 ml   Filed Weights   04/19/24 1857 04/20/24 0526 04/21/24 0611  Weight: 133.4 kg 131.4 kg 125.6 kg    Physical Exam   GEN: No acute distress.   Cardiac: RRR, no murmurs, rubs, or gallops.  Radial site c/d/i Respiratory: Clear to auscultation bilaterally. GI: Soft, nontender, non-distended  MS: edema  Labs   Telemetry: sinus tachycardia   Chemistry Recent Labs  Lab 04/19/24 1438 04/19/24 2248 04/20/24 0240 04/20/24 1922 04/20/24 1925 04/20/24 2307 04/21/24 0321  NA 143  --  142 146* 144  144  --  143  K 3.7  --  3.9 2.9* 3.1*  3.1*  --  3.2*  CL 107  --  105  --   --   --  103  CO2 24  --  29  --   --   --  27  GLUCOSE 136*  --  132*  --   --   --  96  BUN 13  --  10  --   --   --  11  CREATININE 0.84   < > 0.90  --   --  0.91 0.84  CALCIUM  9.1  --  8.7*  --   --   --  8.9  GFRNONAA >60   < > >60  --   --  >60 >60  ANIONGAP 12  --  8  --   --   --  13   < > = values in this interval not displayed.     Hematology Recent Labs  Lab 04/19/24 2248 04/20/24 0240 04/20/24 1922 04/20/24 1925 04/20/24 2307  WBC 4.7 3.9*  --   --  5.4  RBC 4.96  4.97 4.84  --   --  5.05  HGB 12.6* 12.3* 11.2* 11.9*  11.9* 12.8*  HCT 37.5* 36.8* 33.0* 35.0*  35.0* 37.5*  MCV 75.6*  76.0*  --   --  74.3*  MCH 25.4* 25.4*  --   --  25.3*  MCHC 33.6 33.4  --   --  34.1  RDW 13.6 13.4  --   --  13.4  PLT 366 349  --   --  366   BNP Recent Labs  Lab 04/19/24 1438  PROBNP 1,470.0*     Cardiac Studies   Cardiac Studies & Procedures   ______________________________________________________________________________________________ CARDIAC CATHETERIZATION  CARDIAC CATHETERIZATION 04/20/2024  Conclusion   Prox RCA lesion is 20% stenosed.   There is severe left ventricular systolic dysfunction.   The left ventricular ejection fraction is less than 25% by visual estimate.  Findings:  Ao = 129/95 (  110) LV = 127/27 RA = 12 RV = 61/17 PA = 59/26 (40) PCW = 22 Fick cardiac output/index = 7.6/3.1 Thermo CO/CI = 6.5/2.6 PVR = 2.4 Fick 2.8 TD Ao sat = 94% PA sat = 70%, 66% PAPi = 2.8  Assessment: 1. Minimal CAD (20% RCA) 2. Severe NICM EF < 20% 3. Moderate mixed pulmonary HTN 4. Elevated filling pressures with normal cardiac output  Plan/Discussion:  Continue diuresis. Titrated GDMT. Plan cMRI.  Jules Oar, MD 7:46 PM  Findings Coronary Findings Diagnostic  Dominance: Right  Left Main Vessel is angiographically normal.  Left Anterior Descending Vessel is angiographically normal.  Left Circumflex Vessel is angiographically normal.  Right Coronary Artery Prox RCA lesion is 20% stenosed.  Intervention  No interventions have been documented.     ECHOCARDIOGRAM  ECHOCARDIOGRAM COMPLETE 04/20/2024  Narrative ECHOCARDIOGRAM REPORT    Patient Name:   Christopher Hays Date of Exam: 04/20/2024 Medical Rec #:  [id number]  Height:       72.0 in Accession #:    [id number] Weight:       289.7 lb Date of Birth:  [birth year]  BSA:          2.494 m Patient Age:    37 years   BP:           129/91 mmHg Patient Gender: M          HR:           96 bpm. Exam Location:  Inpatient  Procedure: 2D Echo, Cardiac Doppler and Color Doppler (Both Spectral and  Color Flow Doppler were utilized during procedure).  Indications:    CHF  History:        Patient has no prior history of Echocardiogram examinations.  Sonographer:    Janette Medley Referring Phys: 25 ARSHAD N KAKRAKANDY  IMPRESSIONS   1. Left ventricular ejection fraction, by estimation, is 30 to 35%. The left ventricle has moderately decreased function. The left ventricle demonstrates global hypokinesis. The left ventricular internal cavity size was moderately dilated. There is moderate left ventricular hypertrophy. Left ventricular diastolic parameters were normal. 2. Right ventricular systolic function is normal. The right ventricular size is normal. 3. Effusion largest posteriorly . Moderate pericardial effusion. The pericardial effusion is posterior to the left ventricle and anterior to the right ventricle. 4. The mitral valve is abnormal. Mild mitral valve regurgitation. No evidence of mitral stenosis. 5. The aortic valve is tricuspid. Aortic valve regurgitation is not visualized. No aortic stenosis is present. 6. The inferior vena cava is normal in size with greater than 50% respiratory variability, suggesting right atrial pressure of 3 mmHg.  FINDINGS Left Ventricle: Left ventricular ejection fraction, by estimation, is 30 to 35%. The left ventricle has moderately decreased function. The left ventricle demonstrates global hypokinesis. Strain was performed and the global longitudinal strain is indeterminate. The left ventricular internal cavity size was moderately dilated. There is moderate left ventricular hypertrophy. Left ventricular diastolic parameters were normal.  Right Ventricle: The right ventricular size is normal. No increase in right ventricular wall thickness. Right ventricular systolic function is normal.  Left Atrium: Left atrial size was normal in size.  Right Atrium: Right atrial size was normal in size.  Pericardium: Effusion largest posteriorly. A moderately  sized pericardial effusion is present. The pericardial effusion is posterior to the left ventricle and anterior to the right ventricle.  Mitral Valve: The mitral valve is abnormal. There is moderate thickening of the mitral valve leaflet(s). Mild mitral  valve regurgitation. No evidence of mitral valve stenosis.  Tricuspid Valve: The tricuspid valve is normal in structure. Tricuspid valve regurgitation is mild . No evidence of tricuspid stenosis.  Aortic Valve: The aortic valve is tricuspid. Aortic valve regurgitation is not visualized. No aortic stenosis is present.  Pulmonic Valve: The pulmonic valve was normal in structure. Pulmonic valve regurgitation is trivial. No evidence of pulmonic stenosis.  Aorta: The aortic root is normal in size and structure.  Venous: The inferior vena cava is normal in size with greater than 50% respiratory variability, suggesting right atrial pressure of 3 mmHg.  IAS/Shunts: No atrial level shunt detected by color flow Doppler.  Additional Comments: 3D was performed not requiring image post processing on an independent workstation and was indeterminate.   LEFT VENTRICLE PLAX 2D LVIDd:         5.20 cm   Diastology LVIDs:         3.60 cm   LV e' lateral: 7.83 cm/s LV PW:         1.50 cm LV IVS:        1.50 cm LVOT diam:     2.30 cm LV SV:         46 LV SV Index:   18 LVOT Area:     4.15 cm   RIGHT VENTRICLE             IVC RV S prime:     10.30 cm/s  IVC diam: 1.60 cm TAPSE (M-mode): 2.9 cm  LEFT ATRIUM             Index        RIGHT ATRIUM           Index LA Vol (A2C):   65.4 ml 26.22 ml/m  RA Area:     20.00 cm LA Vol (A4C):   84.0 ml 33.68 ml/m  RA Volume:   54.30 ml  21.77 ml/m LA Biplane Vol: 74.8 ml 29.99 ml/m AORTIC VALVE LVOT Vmax:   80.90 cm/s LVOT Vmean:  51.000 cm/s LVOT VTI:    0.111 m  AORTA Ao Root diam: 3.20 cm Ao Asc diam:  3.50 cm   SHUNTS Systemic VTI:  0.11 m Systemic Diam: 2.30 cm  Janelle Mediate  MD Electronically signed by Janelle Mediate MD Signature Date/Time: 04/20/2024/12:40:48 PM    Final          ______________________________________________________________________________________________       Assessment & Plan    Acute Combined Heart Failure - Dilated with PAPi 2.8 and CPO 1.5 Watts - NYHA Class IIIb ; RA pressure 12  MRA 25 mg - Entresto 49-51 mg bid  - SGLT2i started today - continue digoxin; BB at DC - increased lasix  today, possible PO transition tomorrow    Hypertension  - improving, GDMT per above - needs to restart CPAP therapy for OSA   Minimal CAD Type 2DM - on statin  - starting SGLT2i today   OSA and Morbid Obesity - needs GLP1 RA therapy at discuarge   IDA - IV iron PM of 5/13 after CMR  For questions or updates, please contact [facility] Please consult [email or web address] for contact info under Cardiology/STEMI.      Gloriann Larger, MD FASE [facility] Cardiologist [facility]  [address] [place], [address] [place], [place] [address] [phone]  9:43 AM

## 2024-04-22 DIAGNOSIS — I5041 Acute combined systolic (congestive) and diastolic (congestive) heart failure: Secondary | ICD-10-CM

## 2024-04-22 LAB — BASIC METABOLIC PANEL WITH GFR
Anion gap: 12 (ref 5–15)
BUN: 8 mg/dL (ref 6–20)
CO2: 29 mmol/L (ref 22–32)
Calcium: 8.6 mg/dL — ABNORMAL LOW (ref 8.9–10.3)
Chloride: 101 mmol/L (ref 98–111)
Creatinine, Ser: 0.94 mg/dL (ref 0.61–1.24)
GFR, Estimated: >60 mL/min (ref >60)
Glucose, Bld: 108 mg/dL — ABNORMAL HIGH (ref 70–99)
Potassium: 3.2 mmol/L — ABNORMAL LOW (ref 3.5–5.1)
Sodium: 142 mmol/L (ref 135–145)

## 2024-04-22 LAB — GLUCOSE, CAPILLARY
Glucose-Capillary: 101 mg/dL — ABNORMAL HIGH (ref 70–99)
Glucose-Capillary: 183 mg/dL — ABNORMAL HIGH (ref 70–99)
Glucose-Capillary: 215 mg/dL — ABNORMAL HIGH (ref 70–99)
Glucose-Capillary: 203 mg/dL — ABNORMAL HIGH (ref 70–99)

## 2024-04-22 MED ORDER — FUROSEMIDE 10 MG/ML IJ SOLN
40.0000 mg | Freq: Two times a day (BID) | INTRAMUSCULAR | Status: DC
  Administered 2024-04-22 – 2024-04-23 (×3): 40 mg via INTRAVENOUS
  Filled 2024-04-22 (×3): qty 4

## 2024-04-22 MED ORDER — PREDNISONE 20 MG PO TABS
40.0000 mg | ORAL_TABLET | Freq: Every day | ORAL | Status: DC
  Administered 2024-04-22 – 2024-04-23 (×2): 40 mg via ORAL
  Filled 2024-04-22 (×2): qty 2

## 2024-04-22 MED ORDER — POTASSIUM CHLORIDE CRYS ER 20 MEQ PO TBCR
40.0000 meq | EXTENDED_RELEASE_TABLET | Freq: Once | ORAL | Status: AC
  Administered 2024-04-22: 40 meq via ORAL
  Filled 2024-04-22: qty 2

## 2024-04-22 MED ORDER — SACUBITRIL-VALSARTAN 97-103 MG PO TABS
1.0000 | ORAL_TABLET | Freq: Two times a day (BID) | ORAL | Status: DC
  Administered 2024-04-22 – 2024-04-23 (×3): 1 via ORAL
  Filled 2024-04-22 (×3): qty 1

## 2024-04-22 NOTE — Progress Notes (Addendum)
 PROGRESS NOTE    Christopher Hays  RUE:[id number] DOB: 13-Jan-1986 DOA: 04/19/2024 PCP: [facility]   37/M wDM 2, hypertension, hyperlipidemia, sleep apnea, gout presented to the ED with edema, dyspnea, cough over the last few months followed by orthopnea. -In the ED, noted to have bilateral pleural effusion, BNP 1400, troponin 70, EKGs noted sinus tachycardia - Diagnosed with new systolic CHF - R/LHC with minimal nonobstructive disease, elevated filling pressures, EF less than 20%  Subjective: -Feels better today, breathing is improving  Assessment and Plan:  Acute systolic CHF, new diagnosis -Echo noted EF of 30-35%, normal RV, global hypokinesis -[facility] 5/9 with Mild Nonobstructive CAD, Elevated Filling Pressures wedge 22, EF less than 20% - Cards following, Entresto dose increased, continue Aldactone and Jardiance, continuing IV Lasix  today - Likely transition to oral diuretics tomorrow  -dietitian consulted - Cardiac MRI is pending  Moderate pericardial effusion - Etiology is unclear, ?viral - Check ANA  Right thumb gout flare - Poor response to colchicine , add prednisone  today  Elevated troponin likely from CHF.  Remains flat.  - Cath with mild nonobstructive CAD  Hypertension on amlodipine . -Diuretics as above, add Aldactone  Diabetes mellitus type 2  -on Trulicity and Toujeo  26 units at bedtime. - A1c is 7.6  Hyperlipidemia on statins.  History of gout on allopurinol .  Obesity, BMI is 39.2 Sleep apnea  -not been compliant with his CPAP. - Needs diet and lifestyle modification, counseled  Iron deficiency anemia - Add oral iron at discharge - IV iron after cardiac MRI   DVT prophylaxis: lovenox  Code Status: Full Code Family Communication: Spouse at bedside Disposition Plan:   Objective: Vitals:   04/22/24 0123 04/22/24 0449 04/22/24 0700 04/22/24 1029  BP: (!) 136/92 (!) 133/96  124/82  Pulse: 100 98  (!) 103  Resp:    18  Temp: 98 F  (36.7 C) 98.2 F (36.8 C)  98.5 F (36.9 C)  TempSrc: Oral Oral  Oral  SpO2: 94% 97%  97%  Weight:   122.8 kg   Height:        Intake/Output Summary (Last 24 hours) at 04/22/2024 1035 Last data filed at 04/21/2024 2118 Gross per 24 hour  Intake 600 ml  Output --  Net 600 ml   Filed Weights   04/20/24 0526 04/21/24 0611 04/22/24 0700  Weight: 131.4 kg 125.6 kg 122.8 kg    Examination:  General exam: Appears calm and comfortable  HEENT: Positive JVD Respiratory system: Clear Cardiovascular system: S1 & S2 heard, RRR.  Abd: nondistended, soft and nontender.Normal bowel sounds heard. Extremities:+ edema Skin: No rashes Psychiatry:  Mood & affect appropriate.     Data Reviewed:   CBC: Recent Labs  Lab 04/19/24 1438 04/19/24 2248 04/20/24 0240 04/20/24 1922 04/20/24 1925 04/20/24 2307  WBC 4.8 4.7 3.9*  --   --  5.4  NEUTROABS 3.1  --  2.2  --   --   --   HGB 12.3* 12.6* 12.3* 11.2* 11.9*  11.9* 12.8*  HCT 35.8* 37.5* 36.8* 33.0* 35.0*  35.0* 37.5*  MCV 75.2* 75.6* 76.0*  --   --  74.3*  PLT 331 366 349  --   --  366   Basic Metabolic Panel: Recent Labs  Lab 04/19/24 1438 04/19/24 2248 04/20/24 0240 04/20/24 1922 04/20/24 1925 04/20/24 2307 04/21/24 0321 04/22/24 0233  NA 143  --  142 146* 144  144  --  143 142  K 3.7  --  3.9  2.9* 3.1*  3.1*  --  3.2* 3.2*  CL 107  --  105  --   --   --  103 101  CO2 24  --  29  --   --   --  27 29  GLUCOSE 136*  --  132*  --   --   --  96 108*  BUN 13  --  10  --   --   --  11 8  CREATININE 0.84 0.82 0.90  --   --  0.91 0.84 0.94  CALCIUM  9.1  --  8.7*  --   --   --  8.9 8.6*  MG  --  1.4*  --   --   --   --   --   --    GFR: Estimated Creatinine Clearance: 144.2 mL/min (by C-G formula based on SCr of 0.94 mg/dL). Liver Function Tests: No results for input(s): "AST", "ALT", "ALKPHOS", "BILITOT", "PROT", "ALBUMIN" in the last 168 hours. No results for input(s): "LIPASE", "AMYLASE" in the last 168 hours. No  results for input(s): "AMMONIA" in the last 168 hours. Coagulation Profile: No results for input(s): "INR", "PROTIME" in the last 168 hours. Cardiac Enzymes: No results for input(s): "CKTOTAL", "CKMB", "CKMBINDEX", "TROPONINI" in the last 168 hours. BNP (last 3 results) Recent Labs    04/19/24 1438  PROBNP 1,470.0*   HbA1C: Recent Labs    04/19/24 2248  HGBA1C 7.6*   CBG: Recent Labs  Lab 04/21/24 0613 04/21/24 1120 04/21/24 1611 04/21/24 2046 04/22/24 0628  GLUCAP 97 122* 109* 119* 101*   Lipid Profile: No results for input(s): "CHOL", "HDL", "LDLCALC", "TRIG", "CHOLHDL", "LDLDIRECT" in the last 72 hours. Thyroid Function Tests: Recent Labs    04/19/24 2248  TSH 1.316   Anemia Panel: Recent Labs    04/19/24 2248  VITAMINB12 1,208*  FOLATE 9.5  FERRITIN 328  TIBC 302  IRON 26*  RETICCTPCT 0.7   Urine analysis:    Component Value Date/Time   COLORURINE COLORLESS (A) 10/06/2023 2013   APPEARANCEUR CLEAR 10/06/2023 2013   LABSPEC 1.025 10/06/2023 2013   PHURINE 5.5 10/06/2023 2013   GLUCOSEU >1,000 (A) 10/06/2023 2013   HGBUR NEGATIVE 10/06/2023 2013   BILIRUBINUR NEGATIVE 10/06/2023 2013   KETONESUR NEGATIVE 10/06/2023 2013   PROTEINUR TRACE (A) 10/06/2023 2013   NITRITE NEGATIVE 10/06/2023 2013   LEUKOCYTESUR NEGATIVE 10/06/2023 2013   Sepsis Labs: @LABRCNTIP (procalcitonin:4,lacticidven:4)  )No results found for this or any previous visit (from the past 240 hours).   Radiology Studies: CARDIAC CATHETERIZATION Result Date: 04/20/2024   Prox RCA lesion is 20% stenosed.   There is severe left ventricular systolic dysfunction.   The left ventricular ejection fraction is less than 25% by visual estimate. Findings: Ao = 129/95 (110) LV = 127/27 RA = 12 RV = 61/17 PA = 59/26 (40) PCW = 22 Fick cardiac output/index = 7.6/3.1 Thermo CO/CI = 6.5/2.6 PVR = 2.4 Fick 2.8 TD Ao sat = 94% PA sat = 70%, 66% PAPi = 2.8 Assessment: 1. Minimal CAD (20% RCA) 2. Severe  NICM EF < 20% 3. Moderate mixed pulmonary HTN 4. Elevated filling pressures with normal cardiac output Plan/Discussion: Continue diuresis. Titrated GDMT. Plan cMRI. Jules Oar, MD 7:46 PM   ECHOCARDIOGRAM COMPLETE Result Date: 04/20/2024    ECHOCARDIOGRAM REPORT   Patient Name:   Christopher Hays Date of Exam: 04/20/2024 Medical Rec #:  [id number]  Height:       72.0 in Accession #:  [id number] Weight:       289.7 lb Date of Birth:  11-23-1986  BSA:          2.494 m Patient Age:    37 years   BP:           129/91 mmHg Patient Gender: M          HR:           96 bpm. Exam Location:  Inpatient Procedure: 2D Echo, Cardiac Doppler and Color Doppler (Both Spectral and Color            Flow Doppler were utilized during procedure). Indications:    CHF  History:        Patient has no prior history of Echocardiogram examinations.  Sonographer:    Janette Medley Referring Phys: 32 ARSHAD N KAKRAKANDY IMPRESSIONS  1. Left ventricular ejection fraction, by estimation, is 30 to 35%. The left ventricle has moderately decreased function. The left ventricle demonstrates global hypokinesis. The left ventricular internal cavity size was moderately dilated. There is moderate left ventricular hypertrophy. Left ventricular diastolic parameters were normal.  2. Right ventricular systolic function is normal. The right ventricular size is normal.  3. Effusion largest posteriorly . Moderate pericardial effusion. The pericardial effusion is posterior to the left ventricle and anterior to the right ventricle.  4. The mitral valve is abnormal. Mild mitral valve regurgitation. No evidence of mitral stenosis.  5. The aortic valve is tricuspid. Aortic valve regurgitation is not visualized. No aortic stenosis is present.  6. The inferior vena cava is normal in size with greater than 50% respiratory variability, suggesting right atrial pressure of 3 mmHg. FINDINGS  Left Ventricle: Left ventricular ejection fraction, by estimation, is 30 to 35%.  The left ventricle has moderately decreased function. The left ventricle demonstrates global hypokinesis. Strain was performed and the global longitudinal strain is indeterminate. The left ventricular internal cavity size was moderately dilated. There is moderate left ventricular hypertrophy. Left ventricular diastolic parameters were normal. Right Ventricle: The right ventricular size is normal. No increase in right ventricular wall thickness. Right ventricular systolic function is normal. Left Atrium: Left atrial size was normal in size. Right Atrium: Right atrial size was normal in size. Pericardium: Effusion largest posteriorly. A moderately sized pericardial effusion is present. The pericardial effusion is posterior to the left ventricle and anterior to the right ventricle. Mitral Valve: The mitral valve is abnormal. There is moderate thickening of the mitral valve leaflet(s). Mild mitral valve regurgitation. No evidence of mitral valve stenosis. Tricuspid Valve: The tricuspid valve is normal in structure. Tricuspid valve regurgitation is mild . No evidence of tricuspid stenosis. Aortic Valve: The aortic valve is tricuspid. Aortic valve regurgitation is not visualized. No aortic stenosis is present. Pulmonic Valve: The pulmonic valve was normal in structure. Pulmonic valve regurgitation is trivial. No evidence of pulmonic stenosis. Aorta: The aortic root is normal in size and structure. Venous: The inferior vena cava is normal in size with greater than 50% respiratory variability, suggesting right atrial pressure of 3 mmHg. IAS/Shunts: No atrial level shunt detected by color flow Doppler. Additional Comments: 3D was performed not requiring image post processing on an independent workstation and was indeterminate.  LEFT VENTRICLE PLAX 2D LVIDd:         5.20 cm   Diastology LVIDs:         3.60 cm   LV e' lateral: 7.83 cm/s LV PW:         1.50 cm  LV IVS:        1.50 cm LVOT diam:     2.30 cm LV SV:         46 LV  SV Index:   18 LVOT Area:     4.15 cm  RIGHT VENTRICLE             IVC RV S prime:     10.30 cm/s  IVC diam: 1.60 cm TAPSE (M-mode): 2.9 cm LEFT ATRIUM             Index        RIGHT ATRIUM           Index LA Vol (A2C):   65.4 ml 26.22 ml/m  RA Area:     20.00 cm LA Vol (A4C):   84.0 ml 33.68 ml/m  RA Volume:   54.30 ml  21.77 ml/m LA Biplane Vol: 74.8 ml 29.99 ml/m  AORTIC VALVE LVOT Vmax:   80.90 cm/s LVOT Vmean:  51.000 cm/s LVOT VTI:    0.111 m  AORTA Ao Root diam: 3.20 cm Ao Asc diam:  3.50 cm  SHUNTS Systemic VTI:  0.11 m Systemic Diam: 2.30 cm Janelle Mediate MD Electronically signed by Janelle Mediate MD Signature Date/Time: 04/20/2024/12:40:48 PM    Final      Scheduled Meds:  allopurinol   300 mg Oral Daily   digoxin  0.125 mg Oral Daily   empagliflozin  10 mg Oral Daily   enoxaparin  (LOVENOX ) injection  60 mg Subcutaneous Q24H   furosemide   40 mg Intravenous BID   insulin  aspart  0-9 Units Subcutaneous TID WC   insulin  glargine-yfgn  26 Units Subcutaneous QHS   predniSONE   40 mg Oral Q breakfast   rosuvastatin   20 mg Oral QHS   sacubitril-valsartan  1 tablet Oral BID   sodium chloride  flush  3 mL Intravenous Q12H   spironolactone  25 mg Oral Daily   Continuous Infusions:     LOS: 2 days    Time spent:    Deforest Fast, MD [facility]   04/22/2024, 10:35 AM

## 2024-04-22 NOTE — Progress Notes (Signed)
 Progress Note  Patient Name: Christopher Hays Date of Encounter: 04/22/2024 Primary Cardiologist: None   Subjective   No issues overnight.  Vital Signs    Vitals:   04/21/24 2320 04/22/24 0123 04/22/24 0449 04/22/24 0700  BP:  (!) 136/92 (!) 133/96   Pulse: (!) 107 100 98   Resp: 20     Temp:  98 F (36.7 C) 98.2 F (36.8 C)   TempSrc:  Oral Oral   SpO2: 98% 94% 97%   Weight:    122.8 kg  Height:        Intake/Output Summary (Last 24 hours) at 04/22/2024 0745 Last data filed at 04/21/2024 2118 Gross per 24 hour  Intake 840 ml  Output --  Net 840 ml   Filed Weights   04/20/24 0526 04/21/24 0611 04/22/24 0700  Weight: 131.4 kg 125.6 kg 122.8 kg    Physical Exam   GEN: No acute distress.   Cardiac: RRR, no murmurs, rubs, or gallops.  Radial site c/d/i Respiratory: Clear to auscultation bilaterally. GI: Soft, nontender, non-distended  MS: edema  Labs   Telemetry: sinus tachycardia to sinus rhythm   Chemistry Recent Labs  Lab 04/20/24 0240 04/20/24 1922 04/20/24 1925 04/20/24 2307 04/21/24 0321 04/22/24 0233  NA 142   < > 144  144  --  143 142  K 3.9   < > 3.1*  3.1*  --  3.2* 3.2*  CL 105  --   --   --  103 101  CO2 29  --   --   --  27 29  GLUCOSE 132*  --   --   --  96 108*  BUN 10  --   --   --  11 8  CREATININE 0.90  --   --  0.91 0.84 0.94  CALCIUM  8.7*  --   --   --  8.9 8.6*  GFRNONAA >60  --   --  >60 >60 >60  ANIONGAP 8  --   --   --  13 12   < > = values in this interval not displayed.     Hematology Recent Labs  Lab 04/19/24 2248 04/20/24 0240 04/20/24 1922 04/20/24 1925 04/20/24 2307  WBC 4.7 3.9*  --   --  5.4  RBC 4.96  4.97 4.84  --   --  5.05  HGB 12.6* 12.3* 11.2* 11.9*  11.9* 12.8*  HCT 37.5* 36.8* 33.0* 35.0*  35.0* 37.5*  MCV 75.6* 76.0*  --   --  74.3*  MCH 25.4* 25.4*  --   --  25.3*  MCHC 33.6 33.4  --   --  34.1  RDW 13.6 13.4  --   --  13.4  PLT 366 349  --   --  366   BNP Recent Labs  Lab 04/19/24 1438   PROBNP 1,470.0*     Cardiac Studies   Cardiac Studies & Procedures   ______________________________________________________________________________________________ CARDIAC CATHETERIZATION  CARDIAC CATHETERIZATION 04/20/2024  Conclusion   Prox RCA lesion is 20% stenosed.   There is severe left ventricular systolic dysfunction.   The left ventricular ejection fraction is less than 25% by visual estimate.  Findings:  Ao = 129/95 (110) LV = 127/27 RA = 12 RV = 61/17 PA = 59/26 (40) PCW = 22 Fick cardiac output/index = 7.6/3.1 Thermo CO/CI = 6.5/2.6 PVR = 2.4 Fick 2.8 TD Ao sat = 94% PA sat = 70%, 66% PAPi = 2.8  Assessment: 1.  Minimal CAD (20% RCA) 2. Severe NICM EF < 20% 3. Moderate mixed pulmonary HTN 4. Elevated filling pressures with normal cardiac output  Plan/Discussion:  Continue diuresis. Titrated GDMT. Plan cMRI.  Jules Oar, MD 7:46 PM  Findings Coronary Findings Diagnostic  Dominance: Right  Left Main Vessel is angiographically normal.  Left Anterior Descending Vessel is angiographically normal.  Left Circumflex Vessel is angiographically normal.  Right Coronary Artery Prox RCA lesion is 20% stenosed.  Intervention  No interventions have been documented.     ECHOCARDIOGRAM  ECHOCARDIOGRAM COMPLETE 04/20/2024  Narrative ECHOCARDIOGRAM REPORT    Patient Name:   [name] Date of Exam: 04/20/2024 Medical Rec #:  [id number]  Height:       72.0 in Accession #:    [id number] Weight:       289.7 lb Date of Birth:  1986-03-20  BSA:          2.494 m Patient Age:    38 years   BP:           129/91 mmHg Patient Gender: M          HR:           96 bpm. Exam Location:  Inpatient  Procedure: 2D Echo, Cardiac Doppler and Color Doppler (Both Spectral and Color Flow Doppler were utilized during procedure).  Indications:    CHF  History:        Patient has no prior history of Echocardiogram examinations.  Sonographer:    Janette Medley Referring Phys: 41 ARSHAD N KAKRAKANDY  IMPRESSIONS   1. Left ventricular ejection fraction, by estimation, is 30 to 35%. The left ventricle has moderately decreased function. The left ventricle demonstrates global hypokinesis. The left ventricular internal cavity size was moderately dilated. There is moderate left ventricular hypertrophy. Left ventricular diastolic parameters were normal. 2. Right ventricular systolic function is normal. The right ventricular size is normal. 3. Effusion largest posteriorly . Moderate pericardial effusion. The pericardial effusion is posterior to the left ventricle and anterior to the right ventricle. 4. The mitral valve is abnormal. Mild mitral valve regurgitation. No evidence of mitral stenosis. 5. The aortic valve is tricuspid. Aortic valve regurgitation is not visualized. No aortic stenosis is present. 6. The inferior vena cava is normal in size with greater than 50% respiratory variability, suggesting right atrial pressure of 3 mmHg.  FINDINGS Left Ventricle: Left ventricular ejection fraction, by estimation, is 30 to 35%. The left ventricle has moderately decreased function. The left ventricle demonstrates global hypokinesis. Strain was performed and the global longitudinal strain is indeterminate. The left ventricular internal cavity size was moderately dilated. There is moderate left ventricular hypertrophy. Left ventricular diastolic parameters were normal.  Right Ventricle: The right ventricular size is normal. No increase in right ventricular wall thickness. Right ventricular systolic function is normal.  Left Atrium: Left atrial size was normal in size.  Right Atrium: Right atrial size was normal in size.  Pericardium: Effusion largest posteriorly. A moderately sized pericardial effusion is present. The pericardial effusion is posterior to the left ventricle and anterior to the right ventricle.  Mitral Valve: The mitral valve is  abnormal. There is moderate thickening of the mitral valve leaflet(s). Mild mitral valve regurgitation. No evidence of mitral valve stenosis.  Tricuspid Valve: The tricuspid valve is normal in structure. Tricuspid valve regurgitation is mild . No evidence of tricuspid stenosis.  Aortic Valve: The aortic valve is tricuspid. Aortic valve regurgitation is not visualized. No aortic stenosis is  present.  Pulmonic Valve: The pulmonic valve was normal in structure. Pulmonic valve regurgitation is trivial. No evidence of pulmonic stenosis.  Aorta: The aortic root is normal in size and structure.  Venous: The inferior vena cava is normal in size with greater than 50% respiratory variability, suggesting right atrial pressure of 3 mmHg.  IAS/Shunts: No atrial level shunt detected by color flow Doppler.  Additional Comments: 3D was performed not requiring image post processing on an independent workstation and was indeterminate.   LEFT VENTRICLE PLAX 2D LVIDd:         5.20 cm   Diastology LVIDs:         3.60 cm   LV e' lateral: 7.83 cm/s LV PW:         1.50 cm LV IVS:        1.50 cm LVOT diam:     2.30 cm LV SV:         46 LV SV Index:   18 LVOT Area:     4.15 cm   RIGHT VENTRICLE             IVC RV S prime:     10.30 cm/s  IVC diam: 1.60 cm TAPSE (M-mode): 2.9 cm  LEFT ATRIUM             Index        RIGHT ATRIUM           Index LA Vol (A2C):   65.4 ml 26.22 ml/m  RA Area:     20.00 cm LA Vol (A4C):   84.0 ml 33.68 ml/m  RA Volume:   54.30 ml  21.77 ml/m LA Biplane Vol: 74.8 ml 29.99 ml/m AORTIC VALVE LVOT Vmax:   80.90 cm/s LVOT Vmean:  51.000 cm/s LVOT VTI:    0.111 m  AORTA Ao Root diam: 3.20 cm Ao Asc diam:  3.50 cm   SHUNTS Systemic VTI:  0.11 m Systemic Diam: 2.30 cm  Janelle Mediate MD Electronically signed by Janelle Mediate MD Signature Date/Time: 04/20/2024/12:40:48 PM    Final           ______________________________________________________________________________________________       Assessment & Plan    Acute Combined Heart Failure - Dilated with PAPi 2.8 and CPO 1.5 Watts - NYHA Class IIIb ; RA pressure 12  - MRA 25 mg - SGLT2i started yesterday - continue digoxin; BB at DC - still needing IV lasix  today - Increased Enetrseto dose today to decrease SVR    Hypertension  - improving, GDMT per above - needs to restart CPAP therapy for OSA   Minimal CAD Type 2DM - on statin  - starting SGLT2i 04/21/24   OSA and Morbid Obesity - needs GLP1 RA therapy as outpatient  IDA - IV iron PM of 5/13 after CMR  For questions or updates, please contact [facility] Please consult [email or web address] for contact info under Cardiology/STEMI.      Gloriann Larger, MD FASE [facility] Cardiologist [facility]  [address] [place], [address] [place], [place] [address] [phone]  7:45 AM

## 2024-04-22 NOTE — Plan of Care (Signed)
  Problem: Clinical Measurements: Goal: Will remain free from infection Outcome: Progressing Goal: Respiratory complications will improve Outcome: Progressing Goal: Cardiovascular complication will be avoided Outcome: Progressing   Problem: Activity: Goal: Risk for activity intolerance will decrease Outcome: Progressing   Problem: Nutrition: Goal: Adequate nutrition will be maintained Outcome: Progressing   Problem: Elimination: Goal: Will not experience complications related to bowel motility Outcome: Progressing Goal: Will not experience complications related to urinary retention Outcome: Progressing   Problem: Safety: Goal: Ability to remain free from injury will improve Outcome: Progressing   Problem: Metabolic: Goal: Ability to maintain appropriate glucose levels will improve Outcome: Progressing   Problem: Activity: Goal: Ability to return to baseline activity level will improve Outcome: Progressing   Problem: Cardiovascular: Goal: Ability to achieve and maintain adequate cardiovascular perfusion will improve Outcome: Progressing

## 2024-04-22 NOTE — Progress Notes (Addendum)
 Nutrition Education Note  RD consulted for nutrition education regarding new onset CHF. RD working remotely at time of assessment. Will attempt in-person education as able.  RD provided "Heart Failure, Consistent Carbohydrate Nutrition Therapy" handout from the [employer] in discharge instructions. Referral placed for ongoing education at [facility].    Current diet order is Heart Healthy, patient is consuming approximately 80-100% of meals at this time. Labs and medications reviewed.   No further nutrition interventions warranted at this time. If additional nutrition issues arise, please re-consult RD.    Doneta Furbish RD, LDN Clinical Dietitian

## 2024-04-22 NOTE — Discharge Instructions (Signed)
 Heart Healthy, Consistent Carbohydrate Nutrition Therapy   A heart-healthy and consistent carbohydrate diet is recommended to manage heart disease and diabetes. To follow a heart-healthy and consistent carbohydrate diet, Eat a balanced diet with whole grains, fruits and vegetables, and lean protein sources.  Choose heart-healthy unsaturated fats. Limit saturated fats, trans fats, and cholesterol intake. Eat more plant-based or vegetarian meals using beans and soy foods for protein.  Eat whole, unprocessed foods to limit the amount of sodium (salt) you eat.  Choose a consistent amount of carbohydrate at each meal and snack. Limit refined carbohydrates especially sugar, sweets and sugar-sweetened beverages.  If you drink alcohol, do so in moderation: one serving per day (women) and two servings per day (men). o One serving is equivalent to 12 ounces beer, 5 ounces wine, or 1.5 ounces distilled spirits  Tips Tips for Choosing Heart-Healthy Fats Choose lean protein and low-fat dairy foods to reduce saturated fat intake. Saturated fat is usually found in animal-based protein and is associated with certain health risks. Saturated fat is the biggest contributor to raise low-density lipoprotein (LDL) cholesterol levels. Research shows that limiting saturated fat lowers unhealthy cholesterol levels. Eat no more than 7% of your total calories each day from saturated fat. Ask your RDN to help you determine how much saturated fat is right for you. There are many foods that do not contain large amounts of saturated fats. Swapping these foods to replace foods high in saturated fats will help you limit the saturated fat you eat and improve your cholesterol levels. You can also try eating more plant-based or vegetarian meals. Instead of. Try:  Whole milk, cheese, yogurt, and ice cream 1% or skim milk, low-fat cheese, non-fat yogurt, and low-fat ice cream  Fatty, marbled beef and pork Lean beef, pork, or venison   Poultry with skin Poultry without skin  Butter, stick margarine Reduced-fat, whipped, or liquid spreads  Coconut oil, palm oil Liquid vegetable oils: corn, canola, olive, soybean and safflower oils   Avoid foods that contain trans fats. Trans fats increase levels of LDL-cholesterol. Hydrogenated fat in processed foods is the main source of trans fats in foods.  Trans fats can be found in stick margarine, shortening, processed sweets, baked goods, some fried foods, and packaged foods made with hydrogenated oils. Avoid foods with "partially hydrogenated oil" on the ingredient list such as: cookies, pastries, baked goods, biscuits, crackers, microwave popcorn, and frozen dinners. Choose foods with heart healthy fats. Polyunsaturated and monounsaturated fat are unsaturated fats that may help lower your blood cholesterol level when used in place of saturated fat in your diet. Ask your RDN about taking a dietary supplement with plant sterols and stanols to help lower your cholesterol level. Research shows that substituting saturated fats with unsaturated fats is beneficial to cholesterol levels. Try these easy swaps: Instead of. Try:  Butter, stick margarine, or solid shortening Reduced-fat, whipped, or liquid spreads  Beef, pork, or poultry with skin Fish and seafood  Chips, crackers, snack foods Raw or unsalted nuts and seeds or nut butters Hummus with vegetables Avocado on toast  Coconut oil, palm oil Liquid vegetable oils: corn, canola, olive, soybean and safflower oils  Limit the amount of cholesterol you eat to less than 200 milligrams per day. Cholesterol is a substance carried through the bloodstream via lipoproteins, which are known as "transporters" of fat. Some body functions need cholesterol to work properly, but too much cholesterol in the bloodstream can damage arteries and build up blood vessel linings (  which can lead to heart attack and stroke). You should eat less than 200 milligrams  cholesterol per day. People respond differently to eating cholesterol. There is no test available right now that can figure out which people will respond more to dietary cholesterol and which will respond less. For individuals with high intake of dietary cholesterol, different types of increase (none, small, moderate, large) in LDL-cholesterol levels are all possible.  Food sources of cholesterol include egg yolks and organ meats such as liver, gizzards. Limit egg yolks to two to four per week and avoid organ meats like liver and gizzards to control cholesterol intake. Tips for Choosing Heart-Healthy Carbohydrates Consume a consistent amount of carbohydrate It is important to eat foods with carbohydrates in moderation because they impact your blood glucose level. Carbohydrates can be found in many foods such as: Grains (breads, crackers, rice, pasta, and cereals)  Starchy Vegetables (potatoes, corn, and peas)  Beans and legumes  Milk, soy milk, and yogurt  Fruit and fruit juice  Sweets (cakes, cookies, ice cream, jam and jelly) Your RDN will help you set a goal for how many carbohydrate servings to eat at your meals and snacks. For many adults, eating 3 to 5 servings of carbohydrate foods at each meal and 1 or 2 carbohydrate servings for each snack works well.  Check your blood glucose level regularly. It can tell you if you need to adjust when you eat carbohydrates. Choose foods rich in viscous (soluble) fiber Viscous, or soluble, is found in the walls of plant cells. Viscous fiber is found only in plant-based foods. Eating foods with fiber helps to lower your unhealthy cholesterol and keep your blood glucose in range  Rich sources of viscous fiber include vegetables (asparagus, Brussels sprouts, sweet potatoes, turnips) fruit (apricots, mangoes, oranges), legumes, and whole grains (barley, oats, and oat bran).  As you increase your fiber intake gradually, also increase the amount of water you  drink. This will help prevent constipation.  If you have difficulty achieving this goal, ask your RDN about fiber laxatives. Choose fiber supplements made with viscous fibers such as psyllium seed husks or methylcellulose to help lower unhealthy cholesterol.  Limit refined carbohydrates  There are three types of carbohydrates: starches, sugar, and fiber. Some carbohydrates occur naturally in food, like the starches in rice or corn or the sugars in fruits and milk. Refined carbohydrates--foods with high amounts of simple sugars--can raise triglyceride levels. High triglyceride levels are associated with coronary heart disease. Some examples of refined carbohydrate foods are table sugar, sweets, and beverages sweetened with added sugar. Tips for Reducing Sodium (Salt) Although sodium is important for your body to function, too much sodium can be harmful for people with high blood pressure. As sodium and fluid buildup in your tissues and bloodstream, your blood pressure increases. High blood pressure may cause damage to other organs and increase your risk for a stroke. Even if you take a pill for blood pressure or a water pill (diuretic) to remove fluid, it is still important to have less salt in your diet. Ask your doctor and RDN what amount of sodium is right for you. Avoid processed foods. Eat more fresh foods.  Fresh fruits and vegetables are naturally low in sodium, as well as frozen vegetables and fruits that have no added juices or sauces.  Fresh meats are lower in sodium than processed meats, such as bacon, sausage, and hotdogs. Read the nutrition label or ask your butcher to help you find a  fresh meat that is low in sodium. Eat less salt--at the table and when cooking.  A single teaspoon of table salt has 2,300 mg of sodium.  Leave the salt out of recipes for pasta, casseroles, and soups.  Ask your RDN how to cook your favorite recipes without sodium Be a smart shopper.  Look for food packages  that say "salt-free" or "sodium-free." These items contain less than 5 milligrams of sodium per serving.  "Very low-sodium" products contain less than 35 milligrams of sodium per serving.  "Low-sodium" products contain less than 140 milligrams of sodium per serving.  Beware for "Unsalted" or "No Added Salt" products. These items may still be high in sodium. Check the nutrition label. Add flavors to your food without adding sodium.  Try lemon juice, lime juice, fruit juice or vinegar.  Dry or fresh herbs add flavor. Try basil, bay leaf, dill, rosemary, parsley, sage, dry mustard, nutmeg, thyme, and paprika.  Pepper, red pepper flakes, and cayenne pepper can add spice t your meals without adding sodium. Hot sauce contains sodium, but if you use just a drop or two, it will not add up to much.  Buy a sodium-free seasoning blend or make your own at home. Additional Lifestyle Tips Achieve and maintain a healthy weight. Talk with your RDN or your doctor about what is a healthy weight for you. Set goals to reach and maintain that weight.  To lose weight, reduce your calorie intake along with increasing your physical activity. A weight loss of 10 to 15 pounds could reduce LDL-cholesterol by 5 milligrams per deciliter. Participate in physical activity. Talk with your health care team to find out what types of physical activity are best for you. Set a plan to get about 30 minutes of exercise on most days.  Foods Recommended Food Group Foods Recommended  Grains Whole grain breads and cereals, including whole wheat, barley, rye, buckwheat, corn, teff, quinoa, millet, amaranth, brown or wild rice, sorghum, and oats Pasta, especially whole wheat or other whole grain types  [employer] [place] [employer], quinoa or wild rice Whole grain crackers, bread, rolls, pitas Home-made bread with reduced-sodium baking soda  Protein Foods Lean cuts of beef and pork (loin, leg, round, extra lean hamburger)  Skinless [employer] photographer  and other wild game Dried beans and peas Nuts and nut butters Meat alternatives made with soy or textured vegetable protein  Egg whites or egg substitute Cold cuts made with lean meat or soy protein  Dairy Nonfat (skim), low-fat, or 1%-fat milk  Nonfat or low-fat yogurt or cottage cheese Fat-free and low-fat cheese  Vegetables Fresh, frozen, or canned vegetables without added fat or salt   Fruits Fresh, frozen, canned, or dried fruit   Oils Unsaturated oils (corn, olive, peanut, soy, sunflower, canola)  Soft or liquid margarines and vegetable oil spreads  Salad dressings Seeds and nuts  Avocado   Foods Not Recommended Food Group Foods Not Recommended  Grains Breads or crackers topped with salt Cereals (hot or cold) with more than 300 mg sodium per serving Biscuits, cornbread, and other "quick" breads prepared with baking soda Bread crumbs or stuffing mix from a store High-fat bakery products, such as doughnuts, biscuits, croissants, danish pastries, pies, cookies Instant cooking foods to which you add hot water and stir--potatoes, noodles, rice, etc. Packaged starchy foods--seasoned noodle or rice dishes, stuffing mix, macaroni and cheese dinner Snacks made with partially hydrogenated oils, including chips, cheese puffs, snack mixes, regular crackers, butter-flavored popcorn  Protein Foods  Higher-fat cuts of meats (ribs, t-bone steak, regular hamburger) Bacon, sausage, or hot dogs Cold cuts, such as salami or bologna, deli meats, cured meats, corned beef Organ meats (liver, brains, gizzards, sweetbreads) Poultry with skin Fried or smoked meat, poultry, and fish Whole eggs and egg yolks (more than 2-4 per week) Salted legumes, nuts, seeds, or nut/seed butters Meat alternatives with high levels of sodium (>300 mg per serving) or saturated fat (>5 g per serving)  Dairy Whole milk,?2% fat milk, buttermilk Whole milk yogurt or ice cream Cream Half-&-half Cream cheese Sour  cream Cheese  Vegetables Canned or frozen vegetables with salt, fresh vegetables prepared with salt, butter, cheese, or cream sauce Fried vegetables Pickled vegetables such as olives, pickles, or sauerkraut  Fruits Fried fruits Fruits served with butter or cream  Oils Butter, stick margarine, shortening Partially hydrogenated oils or trans fats Tropical oils (coconut, palm, palm kernel oils)  Other Candy, sugar sweetened soft drinks and desserts Salt, sea salt, garlic salt, and seasoning mixes containing salt Bouillon cubes Ketchup, barbecue sauce, Worcestershire sauce, soy sauce, teriyaki sauce Miso Salsa Pickles, olives, relish   Heart Healthy Consistent Carbohydrate Vegetarian (Lacto-Ovo) Sample 1-Day Menu  Breakfast 1 cup oatmeal, cooked (2 carbohydrate servings)   cup blueberries (1 carbohydrate serving)  11 almonds, without salt  1 cup 1% milk (1 carbohydrate serving)  1 cup coffee  Morning Snack 1 cup fat-free plain yogurt (1 carbohydrate serving)  Lunch 1 whole wheat bun (1 carbohydrate servings)  1 black bean burger (1 carbohydrate servings)  1 slice cheddar cheese, low sodium  2 slices tomatoes  2 leaves lettuce  1 teaspoon mustard  1 small pear (1 carbohydrate servings)  1 cup green tea, unsweetened  Afternoon Snack 1/3 cup trail mix with nuts, seeds, and raisins, without salt (1 carbohydrate servinga)  Evening Meal  cup meatless chicken  2/3 cup brown rice, cooked (2 carbohydrate servings)  1 cup broccoli, cooked (2/3 carbohydrate serving)   cup carrots, cooked (1/3 carbohydrate serving)  2 teaspoons olive oil  1 teaspoon balsamic vinegar  1 whole wheat dinner roll (1 carbohydrate serving)  1 teaspoon margarine, soft, tub  1 cup 1% milk (1 carbohydrate serving)  Evening Snack 1 extra small banana (1 carbohydrate serving)  1 tablespoon peanut butter   Heart Healthy Consistent Carbohydrate Vegan Sample 1-Day Menu  Breakfast 1 cup oatmeal, cooked (2  carbohydrate servings)   cup blueberries (1 carbohydrate serving)  11 almonds, without salt  1 cup soymilk fortified with calcium, vitamin B12, and vitamin D  1 cup coffee  Morning Snack 6 ounces soy yogurt (1 carbohydrate servings)  Lunch 1 whole wheat bun(1 carbohydrate servings)  1 black bean burger (1 carbohydrate serving)  2 slices tomatoes  2 leaves lettuce  1 teaspoon mustard  1 small pear (1 carbohydrate servings)  1 cup green tea, unsweetened  Afternoon Snack 1/3 cup trail mix with nuts, seeds, and raisins, without salt (1 carbohydrate servings)  Evening Meal  cup meatless chicken  2/3 cup brown rice, cooked (2 carbohydrate servings)  1 cup broccoli, cooked (2/3 carbohydrate serving)   cup carrots, cooked (1/3 carbohydrate serving)  2 teaspoons olive oil  1 teaspoon balsamic vinegar  1 whole wheat dinner roll (1 carbohydrate serving)  1 teaspoon margarine, soft, tub  1 cup soymilk fortified with calcium, vitamin B12, and vitamin D  Evening Snack 1 extra small banana (1 carbohydrate serving)  1 tablespoon peanut butter    Heart Healthy Consistent Carbohydrate Sample  1-Day Menu  Breakfast 1 cup cooked oatmeal (2 carbohydrate servings)  3/4 cup blueberries (1 carbohydrate serving)  1 ounce almonds  1 cup skim milk (1 carbohydrate serving)  1 cup coffee  Morning Snack 1 cup sugar-free nonfat yogurt (1 carbohydrate serving)  Lunch 2 slices whole-wheat bread (2 carbohydrate servings)  2 ounces lean [place] breast  1 ounce low-fat Swiss cheese  1 teaspoon mustard  1 slice tomato  1 lettuce leaf  1 small pear (1 carbohydrate serving)  1 cup skim milk (1 carbohydrate serving)  Afternoon Snack 1 ounce trail mix with unsalted nuts, seeds, and raisins (1 carbohydrate serving)  Evening Meal 3 ounces salmon  2/3 cup cooked brown rice (2 carbohydrate servings)  1 teaspoon soft margarine  1 cup cooked broccoli with 1/2 cup cooked carrots (1 carbohydrate serving  Carrots,  cooked, boiled, drained, without salt  1 cup lettuce  1 teaspoon olive oil with vinegar for dressing  1 small whole grain roll (1 carbohydrate serving)  1 teaspoon soft margarine  1 cup unsweetened tea  Evening Snack 1 extra-small banana (1 carbohydrate serving)  Copyright 2020  [employer]. All rights reserved.

## 2024-04-22 NOTE — Progress Notes (Signed)
Pt. Places cpap on himself when ready. ?

## 2024-04-23 ENCOUNTER — Inpatient Hospital Stay (HOSPITAL_COMMUNITY)

## 2024-04-23 ENCOUNTER — Other Ambulatory Visit (HOSPITAL_COMMUNITY): Payer: Self-pay

## 2024-04-23 DIAGNOSIS — I3139 Other pericardial effusion (noninflammatory): Secondary | ICD-10-CM

## 2024-04-23 DIAGNOSIS — I5021 Acute systolic (congestive) heart failure: Secondary | ICD-10-CM | POA: Diagnosis not present

## 2024-04-23 DIAGNOSIS — I5043 Acute on chronic combined systolic (congestive) and diastolic (congestive) heart failure: Secondary | ICD-10-CM

## 2024-04-23 LAB — BASIC METABOLIC PANEL WITH GFR
Anion gap: 12 (ref 5–15)
BUN: 13 mg/dL (ref 6–20)
CO2: 27 mmol/L (ref 22–32)
Calcium: 8.5 mg/dL — ABNORMAL LOW (ref 8.9–10.3)
Chloride: 101 mmol/L (ref 98–111)
Creatinine, Ser: 0.94 mg/dL (ref 0.61–1.24)
GFR, Estimated: >60 mL/min (ref >60)
Glucose, Bld: 135 mg/dL — ABNORMAL HIGH (ref 70–99)
Potassium: 3.7 mmol/L (ref 3.5–5.1)
Sodium: 140 mmol/L (ref 135–145)

## 2024-04-23 LAB — ANA W/REFLEX IF POSITIVE: Anti Nuclear Antibody (ANA): NEGATIVE

## 2024-04-23 LAB — GLUCOSE, CAPILLARY
Glucose-Capillary: 118 mg/dL — ABNORMAL HIGH (ref 70–99)
Glucose-Capillary: 255 mg/dL — ABNORMAL HIGH (ref 70–99)

## 2024-04-23 MED ORDER — SPIRONOLACTONE 25 MG PO TABS
25.0000 mg | ORAL_TABLET | Freq: Every day | ORAL | 1 refills | Status: DC
  Filled 2024-04-23: qty 30, 30d supply, fill #0

## 2024-04-23 MED ORDER — IRON 90 (18 FE) MG PO TABS
1.0000 | ORAL_TABLET | Freq: Every day | ORAL | 1 refills | Status: AC
  Filled 2024-04-23: qty 30, fill #0

## 2024-04-23 MED ORDER — PREDNISONE 20 MG PO TABS
40.0000 mg | ORAL_TABLET | Freq: Every day | ORAL | 0 refills | Status: AC
  Filled 2024-04-23: qty 4, 2d supply, fill #0

## 2024-04-23 MED ORDER — EMPAGLIFLOZIN 10 MG PO TABS
10.0000 mg | ORAL_TABLET | Freq: Every day | ORAL | 1 refills | Status: DC
  Filled 2024-04-23: qty 30, 30d supply, fill #0

## 2024-04-23 MED ORDER — CARVEDILOL 3.125 MG PO TABS
3.1250 mg | ORAL_TABLET | Freq: Two times a day (BID) | ORAL | Status: DC
  Administered 2024-04-23: 3.125 mg via ORAL
  Filled 2024-04-23: qty 1

## 2024-04-23 MED ORDER — CARVEDILOL 3.125 MG PO TABS
3.1250 mg | ORAL_TABLET | Freq: Two times a day (BID) | ORAL | 1 refills | Status: DC
  Filled 2024-04-23: qty 60, 30d supply, fill #0

## 2024-04-23 MED ORDER — GADOBUTROL 1 MMOL/ML IV SOLN
10.0000 mL | Freq: Once | INTRAVENOUS | Status: AC | PRN
  Administered 2024-04-23: 10 mL via INTRAVENOUS

## 2024-04-23 MED ORDER — DIGOXIN 125 MCG PO TABS
0.1250 mg | ORAL_TABLET | Freq: Every day | ORAL | 1 refills | Status: DC
Start: 2024-04-24 — End: 2024-06-21
  Filled 2024-04-23: qty 30, 30d supply, fill #0

## 2024-04-23 MED ORDER — FUROSEMIDE 20 MG PO TABS
20.0000 mg | ORAL_TABLET | Freq: Every day | ORAL | 1 refills | Status: DC
  Filled 2024-04-23: qty 30, 30d supply, fill #0

## 2024-04-23 MED ORDER — POTASSIUM CHLORIDE CRYS ER 20 MEQ PO TBCR
40.0000 meq | EXTENDED_RELEASE_TABLET | Freq: Once | ORAL | Status: AC
  Administered 2024-04-23: 40 meq via ORAL
  Filled 2024-04-23: qty 2

## 2024-04-23 MED ORDER — FUROSEMIDE 20 MG PO TABS: 20.0000 mg | ORAL_TABLET | Freq: Every day | ORAL | Status: DC

## 2024-04-23 MED ORDER — SACUBITRIL-VALSARTAN 97-103 MG PO TABS
1.0000 | ORAL_TABLET | Freq: Two times a day (BID) | ORAL | 1 refills | Status: DC
  Filled 2024-04-23: qty 60, 30d supply, fill #0

## 2024-04-23 NOTE — Plan of Care (Signed)
  Problem: Health Behavior/Discharge Planning: Goal: Ability to manage health-related needs will improve Outcome: Progressing   Problem: Clinical Measurements: Goal: Will remain free from infection Outcome: Progressing Goal: Respiratory complications will improve Outcome: Progressing Goal: Cardiovascular complication will be avoided Outcome: Progressing   Problem: Activity: Goal: Risk for activity intolerance will decrease Outcome: Progressing   Problem: Nutrition: Goal: Adequate nutrition will be maintained Outcome: Progressing   Problem: Elimination: Goal: Will not experience complications related to urinary retention Outcome: Progressing   Problem: Safety: Goal: Ability to remain free from injury will improve Outcome: Progressing   Problem: Tissue Perfusion: Goal: Adequacy of tissue perfusion will improve Outcome: Progressing   Problem: Education: Goal: Ability to verbalize understanding of medication therapies will improve Outcome: Progressing   Problem: Cardiac: Goal: Ability to achieve and maintain adequate cardiopulmonary perfusion will improve Outcome: Progressing

## 2024-04-23 NOTE — Progress Notes (Signed)
 Patient ID: Christopher Hays, male   DOB: Nov 03, 1986, 38 y.o.   MRN: [id number]     Advanced Heart Failure Rounding Note  Cardiologist: None  Chief Complaint: CHF Subjective:    Good diuresis again yesterday with weight down further.  No dyspnea this morning, feels better.  BP stable.    Objective:   Weight Range: 121.7 kg Body mass index is 36.37 kg/m.   Vital Signs:   Temp:  [97.4 F (36.3 C)-98.9 F (37.2 C)] 98.3 F (36.8 C) (05/12 0434) Pulse Rate:  [96-110] 96 (05/12 0434) Resp:  [18-20] 19 (05/12 0434) BP: (110-133)/(72-88) 110/72 (05/12 0434) SpO2:  [94 %-97 %] 95 % (05/12 0434) Weight:  [121.7 kg] 121.7 kg (05/12 0434) Last BM Date : 04/22/24  Weight change: Filed Weights   04/21/24 0611 04/22/24 0700 04/23/24 0434  Weight: 125.6 kg 122.8 kg 121.7 kg    Intake/Output:   Intake/Output Summary (Last 24 hours) at 04/23/2024 0727 Last data filed at 04/23/2024 0646 Gross per 24 hour  Intake 800 ml  Output 1051 ml  Net -251 ml      Physical Exam    General:  Well appearing. No resp difficulty HEENT: Normal Neck: Supple. JVP not elevated. Carotids 2+ bilat; no bruits. No lymphadenopathy or thyromegaly appreciated. Cor: PMI nondisplaced. Regular rate & rhythm. No rubs, gallops or murmurs. Lungs: Clear Abdomen: Soft, nontender, nondistended. No hepatosplenomegaly. No bruits or masses. Good bowel sounds. Extremities: No cyanosis, clubbing, rash, edema Neuro: Alert & orientedx3, cranial nerves grossly intact. moves all 4 extremities w/o difficulty. Affect pleasant   Telemetry   NSR 90s (personally reviewed)    Labs    CBC Recent Labs    04/20/24 1925 04/20/24 2307  WBC  --  5.4  HGB 11.9*  11.9* 12.8*  HCT 35.0*  35.0* 37.5*  MCV  --  74.3*  PLT  --  366   Basic Metabolic Panel Recent Labs    40/98/11 0233 04/23/24 0252  NA 142 140  K 3.2* 3.7  CL 101 101  CO2 29 27  GLUCOSE 108* 135*  BUN 8 13  CREATININE 0.94 0.94  CALCIUM  8.6* 8.5*    Liver Function Tests No results for input(s): "AST", "ALT", "ALKPHOS", "BILITOT", "PROT", "ALBUMIN" in the last 72 hours. No results for input(s): "LIPASE", "AMYLASE" in the last 72 hours. Cardiac Enzymes No results for input(s): "CKTOTAL", "CKMB", "CKMBINDEX", "TROPONINI" in the last 72 hours.  BNP: BNP (last 3 results) No results for input(s): "BNP" in the last 8760 hours.  ProBNP (last 3 results) Recent Labs    04/19/24 1438  PROBNP 1,470.0*     D-Dimer No results for input(s): "DDIMER" in the last 72 hours. Hemoglobin A1C No results for input(s): "HGBA1C" in the last 72 hours. Fasting Lipid Panel No results for input(s): "CHOL", "HDL", "LDLCALC", "TRIG", "CHOLHDL", "LDLDIRECT" in the last 72 hours. Thyroid Function Tests No results for input(s): "TSH", "T4TOTAL", "T3FREE", "THYROIDAB" in the last 72 hours.  Invalid input(s): "FREET3"  Other results:   Imaging    No results found.   Medications:     Scheduled Medications:  allopurinol   300 mg Oral Daily   carvedilol  3.125 mg Oral BID WC   digoxin  0.125 mg Oral Daily   empagliflozin  10 mg Oral Daily   enoxaparin  (LOVENOX ) injection  60 mg Subcutaneous Q24H   [START ON 04/24/2024] furosemide   20 mg Oral Daily   insulin  aspart  0-9 Units Subcutaneous TID WC  insulin  glargine-yfgn  26 Units Subcutaneous QHS   potassium chloride   40 mEq Oral Once   predniSONE   40 mg Oral Q breakfast   rosuvastatin   20 mg Oral QHS   sacubitril-valsartan  1 tablet Oral BID   sodium chloride  flush  3 mL Intravenous Q12H   spironolactone  25 mg Oral Daily    Infusions:   PRN Medications: acetaminophen  **OR** acetaminophen , ondansetron  (ZOFRAN ) IV, sodium chloride  flush    Assessment/Plan   1. Acute systolic CHF: Nonischemic cardiomyopathy.  RHC/LHC with minimal CAD, elevated filling pressures, CI 2.6 T/3.1 F.  Echo with EF 30-35%, normal RV.  He has diuresed well, not volume overloaded this morning.  - I will  arrange for cardiac MRI today, will try to get this done before discharge to look for infiltrative disease or prior myocarditis.  - Stop IV Lasix .  Can start Lasix  20 mg po daily (may be able to use prn).  - Continue digoxin 0.125 - Continue Jardiance 10 daily - Continue Entresto 97/103 bid - With preserved cardiac output and full diuresis, will start Coreg 3.125 mg bid.  2. HTN: BP controlled.  3. OSA: Needs to restart on CPAP.  4. Obesity: Continue GLP-1 agonist as outpatient.  5. Fe deficiency anemia: IV Fe after cMRI.   Home after cMRI, this evening or tomorrow morning. CHF clinic followup.    Length of Stay: 3  Peder Bourdon, MD  04/23/2024, 7:27 AM  Advanced Heart Failure Team Pager [phone] (M-F; 7a - 5p)  Please contact [facility] for night-coverage after hours (5p -7a ) and weekends on [email or web address]

## 2024-04-23 NOTE — Progress Notes (Signed)
 Heart Failure Navigator Progress Note  Advanced heart failure team was consulted after initial TOC appt was made. Will cancel this appt as he will follow up with AHF after discharge.   Navigator will sign off.   Jerilyn Monte, PharmD, BCPS Heart Failure Stewardship Pharmacist Phone [phone]

## 2024-04-23 NOTE — TOC Transition Note (Signed)
 Transition of Care [facility]) - Discharge Note   Patient Details  Name: Christopher Hays MRN: [id number] Date of Birth: 1986-02-22  [facility] CM/SW Contact:  Benjiman Bras, RN Phone Number: [phone] 04/23/2024, 2:59 PM   Clinical Narrative:     TOC CM spoke to pt and his wife will provide transportation home. Pt states his CPAP machine is 38 years old. Patient has order for sleep study from 11/2023. Contacted Sleep study and they will run his insurance and set up sleep study. Sleep study will contact patient set up appt. Pt at time of appt did not have insurance.   Final next level of care: Home/Self Care Barriers to Discharge: No Barriers Identified   Patient Goals and CMS Choice Patient states their goals for this hospitalization and ongoing recovery are:: wants to remain independent          Discharge Placement                       Discharge Plan and Services Additional resources added to the After Visit Summary for     Discharge Planning Services: CM Consult                                 Social Drivers of Health (SDOH) Interventions SDOH Screenings   Food Insecurity: No Food Insecurity (04/21/2024)  Housing: Low Risk  (04/21/2024)  Transportation Needs: No Transportation Needs (04/21/2024)  Utilities: Not At Risk (04/21/2024)  Financial Resource Strain: High Risk (09/03/2021)   Received from [facility]  Physical Activity: Insufficiently Active (09/03/2021)   Received [facility]  Social Connections: Unknown (04/19/2022)   Received from [facility]  Stress: Stress Concern Present (09/03/2021)   Received from [facility]  Tobacco Use: Low Risk  (04/19/2024)     Readmission Risk Interventions    04/20/2024    2:19 PM  Readmission Risk Prevention Plan  Post Dischage Appt Complete  Medication Screening Complete  Transportation Screening Complete

## 2024-04-23 NOTE — Discharge Summary (Addendum)
 Physician Discharge Summary  Christopher Hays LOV:[id number] DOB: 1986-08-27 DOA: 04/19/2024  PCP: [facility]  Admit date: 04/19/2024 Discharge date: 04/23/2024  Time spent: 45 minutes  Recommendations for Outpatient Follow-up:  Recommend repeat echo in few months to reassess pericardial effusion Advance heart failure clinic in 2 weeks May need repeat sleep study   Discharge Diagnoses:  Principal Problem:   Acute CHF (HCC) Active Problems:   Gout   Essential hypertension   Diabetes mellitus with hyperglycemia, without long-term current use of insulin  (HCC)   Obesity, Class III, BMI 40-49.9 (morbid obesity)   Mixed hyperlipidemia   Obstructive sleep apnea   Type 2 diabetes mellitus with microalbuminuria, with long-term current use of insulin  (HCC)   Acute CHF (congestive heart failure) (HCC)   Elevated troponin   CHF (congestive heart failure) (HCC)   Discharge Condition: Improving  Diet recommendation: Heart healthy, diabetic  Filed Weights   04/21/24 0611 04/22/24 0700 04/23/24 0434  Weight: 125.6 kg 122.8 kg 121.7 kg    History of present illness:   38/M wDM 2, hypertension, hyperlipidemia, sleep apnea, gout presented to the ED with edema, dyspnea, cough over the last few months followed by orthopnea. -In the ED, noted to have bilateral pleural effusion, BNP 1400, troponin 70, EKGs noted sinus tachycardia - Diagnosed with new systolic CHF - R/LHC with minimal nonobstructive disease, elevated filling pressures, EF less than 20%  Hospital Course:   Acute systolic CHF, new diagnosis, NICM -Echo noted EF of 30-35%, normal RV, global hypokinesis -[facility] 5/9 with Mild Nonobstructive CAD, Elevated Filling Pressures wedge 22, EF less than 20% - Improved with diuresis, now euvolemic - Transition to oral Entresto, Aldactone, Jardiance, Coreg, digoxin and oral Lasix  -Diet education completed -Anticipate discharge today after cardiac MRI   Moderate pericardial  effusion - Etiology is unclear - ANA negative   Right thumb gout flare - Poor response to colchicine , improving on prednisone , continue for 2 more days, on allopurinol    Elevated troponin likely from CHF.  Remains flat.  - Cath with mild nonobstructive CAD   Hypertension on amlodipine . -Diuretics as above, add Aldactone   Diabetes mellitus type 2  -on Trulicity and Toujeo  26 units at bedtime. - A1c is 7.6   Hyperlipidemia on statins.   Obesity, BMI is 39.2 Sleep apnea  -not been compliant with his CPAP for years - Needs diet and lifestyle modification, counseled -I think he will need a repeat sleep study   Iron deficiency anemia - Add oral iron at discharge - IV iron after cardiac MRI    Discharge Exam: Vitals:   04/23/24 0826 04/23/24 1158  BP:  123/87  Pulse:    Resp:  18  Temp: (!) 97.4 F (36.3 C) 97.6 F (36.4 C)  SpO2:  97%   Gen: Awake, Alert, Oriented X 3,  HEENT: no JVD Lungs: Good air movement bilaterally, CTAB CVS: S1S2/RRR Abd: soft, Non tender, non distended, BS present Extremities: No edema Skin: no new rashes on exposed skin   Discharge Instructions   Discharge Instructions     Amb Referral to Nutrition and Diabetic Education   Complete by: As directed       Allergies as of 04/23/2024   No Known Allergies      Medication List     STOP taking these medications    amLODipine  10 MG tablet Commonly known as: NORVASC    prochlorperazine  10 MG tablet Commonly known as: COMPAZINE        TAKE these  medications    albuterol 108 (90 Base) MCG/ACT inhaler Commonly known as: VENTOLIN HFA Inhale 2 puffs into the lungs every 6 (six) hours as needed for shortness of breath.   allopurinol  100 MG tablet Commonly known as: ZYLOPRIM  Take 100 mg by mouth daily.   blood glucose meter kit and supplies Dispense based on patient and insurance preference. Use up to four times daily as directed. (FOR ICD-10 E10.9, E11.9).   Blood Glucose  Monitoring Suppl Devi 1 each by Does not apply route in the morning, at noon, and at bedtime. May substitute to any manufacturer covered by patient's insurance.   carvedilol 3.125 MG tablet Commonly known as: COREG Take 1 tablet (3.125 mg total) by mouth 2 (two) times daily with a meal.   digoxin 0.125 MG tablet Commonly known as: LANOXIN Take 1 tablet (0.125 mg total) by mouth daily. Start taking on: Apr 24, 2024   Entresto 97-103 MG Generic drug: sacubitril-valsartan Take 1 tablet by mouth 2 (two) times daily.   furosemide  20 MG tablet Commonly known as: LASIX  Take 1 tablet (20 mg total) by mouth daily. Start taking on: Apr 24, 2024   Insulin  Pen Needle 31G X 5 MM Misc Use with insulin  pen   Iron 90 (18 Fe) MG Tabs Take 1 tablet by mouth daily after breakfast.   Jardiance 10 MG Tabs tablet Generic drug: empagliflozin Take 1 tablet (10 mg total) by mouth daily. Start taking on: Apr 24, 2024   predniSONE  20 MG tablet Commonly known as: DELTASONE  Take 2 tablets (40 mg total) by mouth daily with breakfast for 2 days. Start taking on: Apr 24, 2024   rosuvastatin  20 MG tablet Commonly known as: CRESTOR  Take 20 mg by mouth at bedtime.   spironolactone 25 MG tablet Commonly known as: ALDACTONE Take 1 tablet (25 mg total) by mouth daily. Start taking on: Apr 24, 2024   Toujeo  SoloStar 300 UNIT/ML Solostar Pen Generic drug: insulin  glargine (1 Unit Dial) Inject 25 Units into the skin at bedtime. What changed: how much to take   Trulicity 1.5 MG/0.5ML Soaj Generic drug: Dulaglutide Inject 1.5 mg into the skin once a week.   VITAMIN D PO Take 1 tablet by mouth daily.       No Known Allergies  Follow-up Information     [facility], [place]. Go on 04/25/2024.   Why: @9 :40am Contact information: [address] [place] [address] [phone]         Bensimhon, Rheta Celestine, MD Follow up.   Specialty: Cardiology Why: 04/30/24 at 3:40  PM  Hospital Follow up in the [facility] at [facility], Entrance C Contact information: [address] [place] [address] [phone]                  The results of significant diagnostics from this hospitalization (including imaging, microbiology, ancillary and laboratory) are listed below for reference.    Significant Diagnostic Studies: CARDIAC CATHETERIZATION Result Date: 04/20/2024   Prox RCA lesion is 20% stenosed.   There is severe left ventricular systolic dysfunction.   The left ventricular ejection fraction is less than 25% by visual estimate. Findings: Ao = 129/95 (110) LV = 127/27 RA = 12 RV = 61/17 PA = 59/26 (40) PCW = 22 Fick cardiac output/index = 7.6/3.1 Thermo CO/CI = 6.5/2.6 PVR = 2.4 Fick 2.8 TD Ao sat = 94% PA sat = 70%, 66% PAPi = 2.8 Assessment: 1.  Minimal CAD (20% RCA) 2. Severe NICM EF < 20% 3. Moderate mixed pulmonary HTN 4. Elevated filling pressures with normal cardiac output Plan/Discussion: Continue diuresis. Titrated GDMT. Plan cMRI. Jules Oar, MD 7:46 PM   ECHOCARDIOGRAM COMPLETE Result Date: 04/20/2024    ECHOCARDIOGRAM REPORT   Patient Name:   Christopher Hays Date of Exam: 04/20/2024 Medical Rec #:  [id number]  Height:       72.0 in Accession #:    [id number] Weight:       289.7 lb Date of Birth:  September 03, 1986  BSA:          2.494 m Patient Age:    37 years   BP:           129/91 mmHg Patient Gender: M          HR:           96 bpm. Exam Location:  Inpatient Procedure: 2D Echo, Cardiac Doppler and Color Doppler (Both Spectral and Color            Flow Doppler were utilized during procedure). Indications:    CHF  History:        Patient has no prior history of Echocardiogram examinations.  Sonographer:    Janette Medley Referring Phys: 1 ARSHAD N KAKRAKANDY IMPRESSIONS  1. Left ventricular ejection fraction, by estimation, is 30 to 35%. The left ventricle has moderately decreased function. The left ventricle  demonstrates global hypokinesis. The left ventricular internal cavity size was moderately dilated. There is moderate left ventricular hypertrophy. Left ventricular diastolic parameters were normal.  2. Right ventricular systolic function is normal. The right ventricular size is normal.  3. Effusion largest posteriorly . Moderate pericardial effusion. The pericardial effusion is posterior to the left ventricle and anterior to the right ventricle.  4. The mitral valve is abnormal. Mild mitral valve regurgitation. No evidence of mitral stenosis.  5. The aortic valve is tricuspid. Aortic valve regurgitation is not visualized. No aortic stenosis is present.  6. The inferior vena cava is normal in size with greater than 50% respiratory variability, suggesting right atrial pressure of 3 mmHg. FINDINGS  Left Ventricle: Left ventricular ejection fraction, by estimation, is 30 to 35%. The left ventricle has moderately decreased function. The left ventricle demonstrates global hypokinesis. Strain was performed and the global longitudinal strain is indeterminate. The left ventricular internal cavity size was moderately dilated. There is moderate left ventricular hypertrophy. Left ventricular diastolic parameters were normal. Right Ventricle: The right ventricular size is normal. No increase in right ventricular wall thickness. Right ventricular systolic function is normal. Left Atrium: Left atrial size was normal in size. Right Atrium: Right atrial size was normal in size. Pericardium: Effusion largest posteriorly. A moderately sized pericardial effusion is present. The pericardial effusion is posterior to the left ventricle and anterior to the right ventricle. Mitral Valve: The mitral valve is abnormal. There is moderate thickening of the mitral valve leaflet(s). Mild mitral valve regurgitation. No evidence of mitral valve stenosis. Tricuspid Valve: The tricuspid valve is normal in structure. Tricuspid valve regurgitation is  mild . No evidence of tricuspid stenosis. Aortic Valve: The aortic valve is tricuspid. Aortic valve regurgitation is not visualized. No aortic stenosis is present. Pulmonic Valve: The pulmonic valve was normal in structure. Pulmonic valve regurgitation is trivial. No evidence of pulmonic stenosis. Aorta: The aortic root is normal in size and structure. Venous: The inferior vena cava is normal in size with greater than 50% respiratory variability, suggesting right atrial  pressure of 3 mmHg. IAS/Shunts: No atrial level shunt detected by color flow Doppler. Additional Comments: 3D was performed not requiring image post processing on an independent workstation and was indeterminate.  LEFT VENTRICLE PLAX 2D LVIDd:         5.20 cm   Diastology LVIDs:         3.60 cm   LV e' lateral: 7.83 cm/s LV PW:         1.50 cm LV IVS:        1.50 cm LVOT diam:     2.30 cm LV SV:         46 LV SV Index:   18 LVOT Area:     4.15 cm  RIGHT VENTRICLE             IVC RV S prime:     10.30 cm/s  IVC diam: 1.60 cm TAPSE (M-mode): 2.9 cm LEFT ATRIUM             Index        RIGHT ATRIUM           Index LA Vol (A2C):   65.4 ml 26.22 ml/m  RA Area:     20.00 cm LA Vol (A4C):   84.0 ml 33.68 ml/m  RA Volume:   54.30 ml  21.77 ml/m LA Biplane Vol: 74.8 ml 29.99 ml/m  AORTIC VALVE LVOT Vmax:   80.90 cm/s LVOT Vmean:  51.000 cm/s LVOT VTI:    0.111 m  AORTA Ao Root diam: 3.20 cm Ao Asc diam:  3.50 cm  SHUNTS Systemic VTI:  0.11 m Systemic Diam: 2.30 cm Janelle Mediate MD Electronically signed by Janelle Mediate MD Signature Date/Time: 04/20/2024/12:40:48 PM    Final    DG Chest 2 View Result Date: 04/19/2024 CLINICAL DATA:  Shortness of breath EXAM: CHEST - 2 VIEW COMPARISON:  Chest radiograph dated 07/08/2018 FINDINGS: Normal lung volumes. Left basilar patchy opacities. Trace blunting of bilateral costophrenic angles. No pneumothorax. Similar enlarged cardiomediastinal silhouette. No acute osseous abnormality. IMPRESSION: 1. Left basilar patchy  opacities, which may represent atelectasis, aspiration, or pneumonia. 2. Trace blunting of bilateral costophrenic angles, which may represent trace pleural effusions. 3. Similar cardiomegaly. Electronically Signed   By: Limin  Xu M.D.   On: 04/19/2024 15:16    Microbiology: No results found for this or any previous visit (from the past 240 hours).   Labs: Basic Metabolic Panel: Recent Labs  Lab 04/19/24 1438 04/19/24 2248 04/20/24 0240 04/20/24 1922 04/20/24 1925 04/20/24 2307 04/21/24 0321 04/22/24 0233 04/23/24 0252  NA 143  --  142 146* 144  144  --  143 142 140  K 3.7  --  3.9 2.9* 3.1*  3.1*  --  3.2* 3.2* 3.7  CL 107  --  105  --   --   --  103 101 101  CO2 24  --  29  --   --   --  27 29 27   GLUCOSE 136*  --  132*  --   --   --  96 108* 135*  BUN 13  --  10  --   --   --  11 8 13   CREATININE 0.84 0.82 0.90  --   --  0.91 0.84 0.94 0.94  CALCIUM  9.1  --  8.7*  --   --   --  8.9 8.6* 8.5*  MG  --  1.4*  --   --   --   --   --   --   --  Liver Function Tests: No results for input(s): "AST", "ALT", "ALKPHOS", "BILITOT", "PROT", "ALBUMIN" in the last 168 hours. No results for input(s): "LIPASE", "AMYLASE" in the last 168 hours. No results for input(s): "AMMONIA" in the last 168 hours. CBC: Recent Labs  Lab 04/19/24 1438 04/19/24 2248 04/20/24 0240 04/20/24 1922 04/20/24 1925 04/20/24 2307  WBC 4.8 4.7 3.9*  --   --  5.4  NEUTROABS 3.1  --  2.2  --   --   --   HGB 12.3* 12.6* 12.3* 11.2* 11.9*  11.9* 12.8*  HCT 35.8* 37.5* 36.8* 33.0* 35.0*  35.0* 37.5*  MCV 75.2* 75.6* 76.0*  --   --  74.3*  PLT 331 366 349  --   --  366   Cardiac Enzymes: No results for input(s): "CKTOTAL", "CKMB", "CKMBINDEX", "TROPONINI" in the last 168 hours. BNP: BNP (last 3 results) No results for input(s): "BNP" in the last 8760 hours.  ProBNP (last 3 results) Recent Labs    04/19/24 1438  PROBNP 1,470.0*    CBG: Recent Labs  Lab 04/22/24 1314 04/22/24 1608  04/22/24 2057 04/23/24 0640 04/23/24 1317  GLUCAP 183* 215* 203* 118* 255*       Signed:  Deforest Fast MD.  [facility] 04/23/2024, 1:21 PM

## 2024-04-30 ENCOUNTER — Ambulatory Visit (HOSPITAL_COMMUNITY)
Admit: 2024-04-30 | Discharge: 2024-04-30 | Disposition: A | Discharge status: Home / Self Care | Attending: Internal Medicine | Admitting: Internal Medicine

## 2024-04-30 ENCOUNTER — Encounter (HOSPITAL_COMMUNITY): Payer: Self-pay | Admitting: Internal Medicine

## 2024-04-30 ENCOUNTER — Encounter (HOSPITAL_COMMUNITY)

## 2024-04-30 VITALS — BP 122/72 | HR 107 | Ht 72.0 in | Wt 271.0 lb

## 2024-04-30 DIAGNOSIS — E669 Obesity, unspecified: Secondary | ICD-10-CM | POA: Diagnosis not present

## 2024-04-30 DIAGNOSIS — Z7985 Long-term (current) use of injectable non-insulin antidiabetic drugs: Secondary | ICD-10-CM | POA: Diagnosis not present

## 2024-04-30 DIAGNOSIS — I10 Essential (primary) hypertension: Secondary | ICD-10-CM | POA: Diagnosis not present

## 2024-04-30 DIAGNOSIS — Z7984 Long term (current) use of oral hypoglycemic drugs: Secondary | ICD-10-CM | POA: Diagnosis not present

## 2024-04-30 DIAGNOSIS — E785 Hyperlipidemia, unspecified: Secondary | ICD-10-CM | POA: Insufficient documentation

## 2024-04-30 DIAGNOSIS — G4733 Obstructive sleep apnea (adult) (pediatric): Secondary | ICD-10-CM | POA: Insufficient documentation

## 2024-04-30 DIAGNOSIS — I2510 Atherosclerotic heart disease of native coronary artery without angina pectoris: Secondary | ICD-10-CM | POA: Diagnosis not present

## 2024-04-30 DIAGNOSIS — R000 Tachycardia, unspecified: Secondary | ICD-10-CM | POA: Insufficient documentation

## 2024-04-30 DIAGNOSIS — Z6836 Body mass index (BMI) 36.0-36.9, adult: Secondary | ICD-10-CM | POA: Diagnosis not present

## 2024-04-30 DIAGNOSIS — I5021 Acute systolic (congestive) heart failure: Secondary | ICD-10-CM | POA: Diagnosis not present

## 2024-04-30 DIAGNOSIS — I110 Hypertensive heart disease with heart failure: Secondary | ICD-10-CM | POA: Diagnosis not present

## 2024-04-30 DIAGNOSIS — I5022 Chronic systolic (congestive) heart failure: Secondary | ICD-10-CM | POA: Diagnosis present

## 2024-04-30 DIAGNOSIS — Z79899 Other long term (current) drug therapy: Secondary | ICD-10-CM | POA: Insufficient documentation

## 2024-04-30 DIAGNOSIS — Z794 Long term (current) use of insulin: Secondary | ICD-10-CM | POA: Diagnosis not present

## 2024-04-30 DIAGNOSIS — I428 Other cardiomyopathies: Secondary | ICD-10-CM | POA: Insufficient documentation

## 2024-04-30 DIAGNOSIS — I3139 Other pericardial effusion (noninflammatory): Secondary | ICD-10-CM | POA: Diagnosis not present

## 2024-04-30 DIAGNOSIS — E119 Type 2 diabetes mellitus without complications: Secondary | ICD-10-CM | POA: Diagnosis not present

## 2024-04-30 LAB — CBC
HCT: 42.6 % (ref 39.0–52.0)
Hemoglobin: 14.2 g/dL (ref 13.0–17.0)
MCH: 24.6 pg — ABNORMAL LOW (ref 26.0–34.0)
MCHC: 33.3 g/dL (ref 30.0–36.0)
MCV: 73.8 fL — ABNORMAL LOW (ref 80.0–100.0)
Platelets: 413 10*3/uL — ABNORMAL HIGH (ref 150–400)
RBC: 5.77 MIL/uL (ref 4.22–5.81)
RDW: 13.8 % (ref 11.5–15.5)
WBC: 6.1 10*3/uL (ref 4.0–10.5)
nRBC: 0 % (ref 0.0–0.2)

## 2024-04-30 LAB — BASIC METABOLIC PANEL WITH GFR
Anion gap: 14 (ref 5–15)
BUN: 21 mg/dL — ABNORMAL HIGH (ref 6–20)
CO2: 22 mmol/L (ref 22–32)
Calcium: 9.8 mg/dL (ref 8.9–10.3)
Chloride: 105 mmol/L (ref 98–111)
Creatinine, Ser: 0.9 mg/dL (ref 0.61–1.24)
GFR, Estimated: >60 mL/min (ref >60)
Glucose, Bld: 234 mg/dL — ABNORMAL HIGH (ref 70–99)
Potassium: 4 mmol/L (ref 3.5–5.1)
Sodium: 141 mmol/L (ref 135–145)

## 2024-04-30 LAB — DIGOXIN LEVEL: Digoxin Level: <0.4 ng/mL — ABNORMAL LOW (ref 0.8–2.0)

## 2024-04-30 LAB — BRAIN NATRIURETIC PEPTIDE: B Natriuretic Peptide: 297.8 pg/mL — ABNORMAL HIGH (ref 0.0–100.0)

## 2024-04-30 MED ORDER — IVABRADINE HCL 5 MG PO TABS: 5.0000 mg | ORAL_TABLET | Freq: Two times a day (BID) | ORAL | 5 refills | Status: DC

## 2024-04-30 NOTE — Progress Notes (Signed)
 ADVANCED HF CLINIC NOTE  Chief Complaint: Heart failure  HPI:  Christopher Hays is a 38 y.o. male with type 2DM, HTN, HLD, and untreated OSA admitted w/ new systolic heart failure on 04/20/24.   Echo with EF 30-35%, normal RV. RHC/LHC 5/25: with minimal CAD, elevated filling pressures, CI 2.6 T/3.1 F.     Cardiac MRI 5/25: LVEF 27% RV 29%.  Moderate circumferential pericardial effusion without tamponade. There is mid-wall LGE in the basal anteroseptal wall and the basal to mid inferoseptal wall suggestive of prior myocarditis or possibly an infiltrative disease such as cardiac sarcoidosis. Elevated ECV 38% suggesting elevated myocardial fibrotic content.  Says he feels pretty good. Taking it easy. Can do all ADLs without any problem. Compliant with meds. No low BPs    ROS: All systems negative except as listed in HPI, PMH and Problem List.  SH:  Social History   Socioeconomic History   Marital status: Married    Spouse name: Not on file   Number of children: Not on file   Years of education: Not on file   Highest education level: Not on file  Occupational History   Occupation: unemployed due to covid  Tobacco Use   Smoking status: Never   Smokeless tobacco: Never  Vaping Use   Vaping status: Never Used  Substance and Sexual Activity   Alcohol use: Yes    Comment: occasional    Drug use: No   Sexual activity: Not on file  Other Topics Concern   Not on file  Social History Narrative   Not on file   Social Drivers of Health   Financial Resource Strain: High Risk (09/03/2021)   Received from [facility]   Overall Financial Resource Strain (CARDIA)    Difficulty of Paying Living Expenses: Very hard  Food Insecurity: No Food Insecurity (04/21/2024)   Hunger Vital Sign    Worried About Running Out of Food in the Last Year: Never true    Ran Out of Food in the Last Year: Never true  Transportation Needs: No Transportation Needs (04/21/2024)   PRAPARE - Doctor, general practice (Medical): No    Lack of Transportation (Non-Medical): No  Physical Activity: Insufficiently Active (09/03/2021)   Received from [facility]   Exercise Vital Sign    Days of Exercise per Week: 3 days    Minutes of Exercise per Session: 30 min  Stress: Stress Concern Present (09/03/2021)   Received from [facility] of Occupational Health - Occupational Stress Questionnaire    Feeling of Stress : Rather much  Social Connections: Unknown (04/19/2022)   Received from [facility]   Social Network    Social Network: Not on file  Intimate Partner Violence: Not At Risk (04/21/2024)   Humiliation, Afraid, Rape, and Kick questionnaire    Fear of Current or Ex-Partner: No    Emotionally Abused: No    Physically Abused: No    Sexually Abused: No    FH:  Family History  Problem Relation Age of Onset   Gout Paternal Grandmother    Gout Paternal Grandfather    Diabetes Neg Hx     Past Medical History:  Diagnosis Date   Diabetes mellitus without complication (HCC)    Gout    Hypertension    Sleep apnea     Current Outpatient Medications  Medication Sig Dispense Refill   albuterol (VENTOLIN HFA) 108 (90 Base) MCG/ACT inhaler Inhale 2 puffs into  the lungs every 6 (six) hours as needed for shortness of breath.     allopurinol  (ZYLOPRIM ) 100 MG tablet Take 100 mg by mouth daily.  0   blood glucose meter kit and supplies Dispense based on patient and insurance preference. Use up to four times daily as directed. (FOR ICD-10 E10.9, E11.9). 1 each 0   Blood Glucose Monitoring Suppl DEVI 1 each by Does not apply route in the morning, at noon, and at bedtime. May substitute to any manufacturer covered by patient's insurance. 1 each 0   carvedilol  (COREG ) 3.125 MG tablet Take 1 tablet (3.125 mg total) by mouth 2 (two) times daily with a meal. 60 tablet 1   digoxin  (LANOXIN ) 0.125 MG tablet Take 1 tablet (0.125 mg total) by mouth daily. 30 tablet 1    Dulaglutide (TRULICITY) 1.5 MG/0.5ML SOAJ Inject 1.5 mg into the skin once a week. 2 mL 2   empagliflozin  (JARDIANCE ) 10 MG TABS tablet Take 1 tablet (10 mg total) by mouth daily. 30 tablet 1   Ferrous Sulfate  (IRON ) 90 (18 Fe) MG TABS Take 1 tablet by mouth daily after breakfast. 30 tablet 1   furosemide  (LASIX ) 20 MG tablet Take 1 tablet (20 mg total) by mouth daily. 30 tablet 1   Insulin  Pen Needle 31G X 5 MM MISC Use with insulin  pen 100 each 1   rosuvastatin  (CRESTOR ) 20 MG tablet Take 20 mg by mouth at bedtime.     sacubitril -valsartan  (ENTRESTO ) 97-103 MG Take 1 tablet by mouth 2 (two) times daily. 60 tablet 1   spironolactone  (ALDACTONE ) 25 MG tablet Take 1 tablet (25 mg total) by mouth daily. 30 tablet 1   TOUJEO  SOLOSTAR 300 UNIT/ML Solostar Pen Inject 25 Units into the skin at bedtime. (Patient taking differently: Inject 26 Units into the skin at bedtime.) 10 mL 2   VITAMIN D PO Take 1 tablet by mouth daily.     No current facility-administered medications for this encounter.    Vitals:   04/30/24 1600  BP: 122/72  Pulse: (!) 107  Weight: 122.9 kg (271 lb)  Height: 6' (1.829 m)    PHYSICAL EXAM:  General:  Well appearing. No resp difficulty HEENT: normal Neck: supple. JVP flat. Carotids 2+ bilaterally; no bruits. No lymphadenopathy or thryomegaly appreciated. Cor: PMI normal. Regular rate & rhythm. No rubs, gallops or murmurs. Lungs: clear Abdomen: soft, nontender, nondistended. No hepatosplenomegaly. No bruits or masses. Good bowel sounds. Extremities: no cyanosis, clubbing, rash, edema Neuro: alert & orientedx3, cranial nerves grossly intact. Moves all 4 extremities w/o difficulty. Affect pleasant.   ECG: STach 106 biatrial enlargement No ST-Ts QRS 86ms Personally reviewed     ASSESSMENT & PLAN:   1. 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 - RHC/LHC 5/25: with minimal CAD, elevated filling pressures, CI 2.6 T/3.1 F.      - cardiac MRI 5/25: LVEF 27% RV 29%.  Moderate circumferential pericardial effusion without tamponade. There is mid-wall LGE in the basal anteroseptal wall and the basal to mid inferoseptal wall suggestive of prior myocarditis or possibly an infiltrative disease such as cardiac sarcoidosis. Elevated ECV 38% suggesting elevated myocardial fibrotic content. - Currently NYHA II Volume ok  - Continue digoxin  0.125 - Continue Jardiance  10 daily - Continue Entresto  97/103 bid - Continue Coreg  3.125 mg bid. - Continue sprio 25 - Start ivabradine  5 bid  - Check PET scan to evalaute for sarcoidosis. - No  FHX to suggest genetic CM - Labs today 2. HTN:  - Blood pressure well controlled. Continue current regimen. 3. OSA:  - On CPAP 4. Obesity: - consider GLP1RA   Concha Sudol, MD  4:23 PM

## 2024-04-30 NOTE — Patient Instructions (Addendum)
 Labs done today. We will contact you only if your labs are abnormal.  START Ivabradine  5mg  (1 tablet) by mouth 2 times daily.   No other medication changes were made. Please continue all current medications as prescribed.  The provider has requested that you have a Cardiac PET Scan done. This has to be approved through your insurance company prior to scheduling, once approved we will contact you to schedule an appointment.   Your physician recommends that you schedule a follow-up appointment in: 1 month.   If you have any questions or concerns before your next appointment please send us  a message through [place] or call our office at [phone].    TO LEAVE A MESSAGE FOR THE NURSE SELECT OPTION 2, PLEASE LEAVE A MESSAGE INCLUDING: YOUR NAME DATE OF BIRTH CALL BACK NUMBER REASON FOR CALL**this is important as we prioritize the call backs  YOU WILL RECEIVE A CALL BACK THE SAME DAY AS LONG AS YOU CALL BEFORE 4:00 PM   Do the following things EVERYDAY: Weigh yourself in the morning before breakfast. Write it down and keep it in a log. Take your medicines as prescribed Eat low salt foods--Limit salt (sodium) to 2000 mg per day.  Stay as active as you can everyday Limit all fluids for the day to less than 2 liters   At the [facility], you and your health needs are our priority. As part of our continuing mission to provide you with exceptional heart care, we have created designated Provider Care Teams. These Care Teams include your primary Cardiologist (physician) and Advanced Practice Providers (APPs- Physician Assistants and Nurse Practitioners) who all work together to provide you with the care you need, when you need it.   You may see any of the following providers on your designated Care Team at your next follow up: Dr Jules Oar Dr Peder Bourdon Dr. Mimi Alt, NP Ruddy Corral, [place] [facility] [place], [place] Dennise Fitz,  NP Luster Salters, PharmD   Please be sure to bring in all your medications bottles to every appointment.    Thank you for choosing [facility]

## 2024-05-04 ENCOUNTER — Encounter (HOSPITAL_BASED_OUTPATIENT_CLINIC_OR_DEPARTMENT_OTHER): Payer: Self-pay

## 2024-05-04 ENCOUNTER — Telehealth (HOSPITAL_COMMUNITY): Payer: Self-pay | Admitting: Pharmacy Technician

## 2024-05-04 ENCOUNTER — Other Ambulatory Visit (HOSPITAL_COMMUNITY): Payer: Self-pay

## 2024-05-04 NOTE — Telephone Encounter (Signed)
 Patient Advocate Encounter   Received notification from [employer] that prior authorization for Corlanor is required.   PA submitted on CoverMyMeds Key [id number] Status is pending   Will continue to follow.

## 2024-05-08 ENCOUNTER — Other Ambulatory Visit (HOSPITAL_COMMUNITY): Payer: Self-pay

## 2024-05-08 NOTE — Telephone Encounter (Signed)
 Advanced Heart Failure Patient Advocate Encounter  Prior Authorization for Corlanor has been approved.    PA# [id number] Effective dates: 05/04/24 through 05/04/25  Patients co-pay is $4 (90 days, dual insured). Medication was filled 05/23.  Correne Dillon, CPhT

## 2024-05-10 ENCOUNTER — Encounter: Payer: Self-pay | Admitting: Sleep Medicine

## 2024-05-10 ENCOUNTER — Ambulatory Visit (INDEPENDENT_AMBULATORY_CARE_PROVIDER_SITE_OTHER): Admitting: Sleep Medicine

## 2024-05-10 VITALS — BP 120/80 | HR 97 | Temp 97.1°F | Ht 72.0 in | Wt 268.6 lb

## 2024-05-10 DIAGNOSIS — G4733 Obstructive sleep apnea (adult) (pediatric): Secondary | ICD-10-CM

## 2024-05-10 DIAGNOSIS — E669 Obesity, unspecified: Secondary | ICD-10-CM | POA: Diagnosis not present

## 2024-05-10 DIAGNOSIS — I10 Essential (primary) hypertension: Secondary | ICD-10-CM | POA: Diagnosis not present

## 2024-05-10 DIAGNOSIS — I509 Heart failure, unspecified: Secondary | ICD-10-CM

## 2024-05-10 NOTE — Patient Instructions (Addendum)

## 2024-05-10 NOTE — Progress Notes (Signed)
 Name:Christopher Hays MRN: [id number] DOB: 06/14/1986   CHIEF COMPLAINT:  ESTABLISH CARE FOR OSA   HISTORY OF PRESENT ILLNESS:  [name] is a 38 y.o. w/ a h/o OSA, CHF with reduced EF, DMII, obesity, HTN and hyperlipidemia who presents for reassessment of OSA. Reports that he was initially diagnosed with mild OSA around 7 years ago and subsequently started on CPAP therapy. Reports using CPAP therapy inconsistently for the last few years due to mask discomfort and skin irritation. Reports a 60 lb weight loss over the last few years. Reports c/o loud snoring and excessive daytime sleepiness. Reports nocturnal awakenings due to nocturia, however does not have difficulty falling back to sleep. Denies morning headaches, RLS symptoms, dream enactment, cataplexy, hypnagogic or hypnapompic hallucinations. Reports a family history of sleep apnea. Denies drowsy driving. Denies caffeine use, denies alcohol, tobacco or denies illicit drug use.   Bedtime 11 pm-12 am Sleep onset 15 mins Rise time 6 am   EPWORTH SLEEP SCORE 11    05/10/2024    1:00 PM  Results of the Epworth flowsheet  Sitting and reading 3  Watching TV 1  Sitting, inactive in a public place (e.g. a theatre or a meeting) 2  As a passenger in a car for an hour without a break 2  Lying down to rest in the afternoon when circumstances permit 2  Sitting and talking to someone 0  Sitting quietly after a lunch without alcohol 1  In a car, while stopped for a few minutes in traffic 0  Total score 11    PAST MEDICAL HISTORY :   has a past medical history of Diabetes mellitus without complication (HCC), Gout, Hypertension, and Sleep apnea.  has a past surgical history that includes RIGHT/LEFT HEART CATH AND CORONARY ANGIOGRAPHY (N/A, 04/20/2024). Prior to Admission medications   Medication Sig Start Date End Date Taking? Authorizing Provider  albuterol (VENTOLIN HFA) 108 (90 Base) MCG/ACT inhaler Inhale 2 puffs into the lungs every 6  (six) hours as needed for shortness of breath. 02/09/24 04/30/24  [provider]  allopurinol  (ZYLOPRIM ) 100 MG tablet Take 100 mg by mouth daily. 06/29/18   [provider]  blood glucose meter kit and supplies Dispense based on patient and insurance preference. Use up to four times daily as directed. (FOR ICD-10 E10.9, E11.9). 08/03/19   Mikhail, Maryann, DO  Blood Glucose Monitoring Suppl DEVI 1 each by Does not apply route in the morning, at noon, and at bedtime. May substitute to any manufacturer covered by patient's insurance. 10/07/23   Charmayne Cooper, MD  carvedilol  (COREG ) 3.125 MG tablet Take 1 tablet (3.125 mg total) by mouth 2 (two) times daily with a meal. 04/23/24   Deforest Fast, MD  digoxin  (LANOXIN ) 0.125 MG tablet Take 1 tablet (0.125 mg total) by mouth daily. 04/24/24   Deforest Fast, MD  Dulaglutide (TRULICITY) 1.5 MG/0.5ML SOAJ Inject 1.5 mg into the skin once a week. 10/07/23   Charmayne Cooper, MD  empagliflozin  (JARDIANCE ) 10 MG TABS tablet Take 1 tablet (10 mg total) by mouth daily. 04/24/24   Deforest Fast, MD  Ferrous Sulfate  (IRON ) 90 (18 Fe) MG TABS Take 1 tablet by mouth daily after breakfast. 04/23/24   Deforest Fast, MD  furosemide  (LASIX ) 20 MG tablet Take 1 tablet (20 mg total) by mouth daily. 04/24/24   Deforest Fast, MD  Insulin  Pen Needle 31G X 5 MM MISC Use with insulin  pen 08/03/19   Mikhail,  Maryann, DO  ivabradine  (CORLANOR) 5 MG TABS tablet Take 1 tablet (5 mg total) by mouth 2 (two) times daily with a meal. 04/30/24   Bensimhon, Rheta Celestine, MD  rosuvastatin  (CRESTOR ) 20 MG tablet Take 20 mg by mouth at bedtime. 07/23/20   [provider]  sacubitril -valsartan  (ENTRESTO ) 97-103 MG Take 1 tablet by mouth 2 (two) times daily. 04/23/24   Deforest Fast, MD  spironolactone  (ALDACTONE ) 25 MG tablet Take 1 tablet (25 mg total) by mouth daily. 04/24/24   Deforest Fast, MD  TOUJEO  SOLOSTAR 300 UNIT/ML Solostar Pen Inject 25 Units into  the skin at bedtime. Patient taking differently: Inject 26 Units into the skin at bedtime. 10/07/23   Charmayne Cooper, MD  VITAMIN D PO Take 1 tablet by mouth daily.    [provider]   No Known Allergies  FAMILY HISTORY:  family history includes Gout in his paternal grandfather and paternal grandmother. SOCIAL HISTORY:  reports that he has never smoked. He has never used smokeless tobacco. He reports current alcohol use. He reports that he does not use drugs.   Review of Systems:  Gen:  Denies  fever, sweats, chills weight loss  HEENT: Denies blurred vision, double vision, ear pain, eye pain, hearing loss, nose bleeds, sore throat Cardiac:  No dizziness, chest pain or heaviness, chest tightness,edema, No JVD Resp:   No cough, -sputum production, -shortness of breath,-wheezing, -hemoptysis,  Gi: Denies swallowing difficulty, stomach pain, nausea or vomiting, diarrhea, constipation, bowel incontinence Gu:  Denies bladder incontinence, burning urine Ext:   Denies Joint pain, stiffness or swelling Skin: Denies  skin rash, easy bruising or bleeding or hives Endoc:  Denies polyuria, polydipsia , polyphagia or weight change Psych:   Denies depression, insomnia or hallucinations  Other:  All other systems negative  VITAL SIGNS: BP 120/80 (BP Location: Right Arm, Cuff Size: Large)   Pulse 97   Temp (!) 97.1 F (36.2 C)   Ht 6' (1.829 m)   Wt 268 lb 9.6 oz (121.8 kg)   SpO2 98%   BMI 36.43 kg/m    Physical Examination:   General Appearance: No distress  EYES PERRLA, EOM intact.   NECK Supple, No JVD Pulmonary: normal breath sounds, No wheezing.  CardiovascularNormal S1,S2.  No m/r/g.   Abdomen: Benign, Soft, non-tender. Skin:   warm, no rashes, no ecchymosis  Extremities: normal, no cyanosis, clubbing. Neuro:without focal findings,  speech normal  PSYCHIATRIC: Mood, affect within normal limits.   ASSESSMENT AND PLAN  OSA Due to significant weight loss, will  reassess apnea with a split night study. Discussed the consequences of untreated sleep apnea. Advised not to drive drowsy for safety of patient and others. Will complete further evaluation with a split night study and follow up to review results.    CHF Stable, following with cardiology.  HTN Stable, on current management. Following with PCP.     Obesity Counseled patient on diet and lifestyle modification.    MEDICATION ADJUSTMENTS/LABS AND TESTS ORDERED: Recommend Sleep Study   Patient  satisfied with Plan of action and management. All questions answered  Follow up to review split night study results and treatment plan.   I spent a total of 51 minutes reviewing chart data, face-to-face evaluation with the patient, counseling and coordination of care as detailed above.    Uriah Trueba, M.D.  Sleep Medicine [facility]

## 2024-05-28 ENCOUNTER — Telehealth (HOSPITAL_COMMUNITY): Payer: Self-pay

## 2024-05-28 NOTE — Telephone Encounter (Signed)
 Called to confirm/remind patient of their appointment at the [facility] on 05/29/24.   Appointment:   [] Confirmed  [x] Left mess   [] No answer/No voice mail  [] VM Full/unable to leave message  [] Phone not in service  And to bring in all medications and/or complete list.

## 2024-05-29 ENCOUNTER — Other Ambulatory Visit (HOSPITAL_COMMUNITY): Payer: Self-pay

## 2024-05-29 ENCOUNTER — Ambulatory Visit (HOSPITAL_COMMUNITY): Payer: Self-pay | Admitting: Cardiology

## 2024-05-29 ENCOUNTER — Ambulatory Visit (HOSPITAL_COMMUNITY)
Admission: RE | Admit: 2024-05-29 | Discharge: 2024-05-29 | Disposition: A | Discharge status: Home / Self Care | Source: Ambulatory Visit | Attending: Cardiology | Admitting: Cardiology

## 2024-05-29 ENCOUNTER — Encounter (HOSPITAL_COMMUNITY): Payer: Self-pay

## 2024-05-29 VITALS — BP 142/108 | HR 104 | Wt 263.6 lb

## 2024-05-29 DIAGNOSIS — R9431 Abnormal electrocardiogram [ECG] [EKG]: Secondary | ICD-10-CM | POA: Insufficient documentation

## 2024-05-29 DIAGNOSIS — Z794 Long term (current) use of insulin: Secondary | ICD-10-CM | POA: Diagnosis not present

## 2024-05-29 DIAGNOSIS — I428 Other cardiomyopathies: Secondary | ICD-10-CM | POA: Insufficient documentation

## 2024-05-29 DIAGNOSIS — G4733 Obstructive sleep apnea (adult) (pediatric): Secondary | ICD-10-CM | POA: Insufficient documentation

## 2024-05-29 DIAGNOSIS — I5022 Chronic systolic (congestive) heart failure: Secondary | ICD-10-CM

## 2024-05-29 DIAGNOSIS — M25561 Pain in right knee: Secondary | ICD-10-CM | POA: Insufficient documentation

## 2024-05-29 DIAGNOSIS — Z6835 Body mass index (BMI) 35.0-35.9, adult: Secondary | ICD-10-CM | POA: Insufficient documentation

## 2024-05-29 DIAGNOSIS — E119 Type 2 diabetes mellitus without complications: Secondary | ICD-10-CM | POA: Insufficient documentation

## 2024-05-29 DIAGNOSIS — Z7984 Long term (current) use of oral hypoglycemic drugs: Secondary | ICD-10-CM | POA: Insufficient documentation

## 2024-05-29 DIAGNOSIS — I110 Hypertensive heart disease with heart failure: Secondary | ICD-10-CM | POA: Insufficient documentation

## 2024-05-29 DIAGNOSIS — E785 Hyperlipidemia, unspecified: Secondary | ICD-10-CM | POA: Diagnosis not present

## 2024-05-29 DIAGNOSIS — E669 Obesity, unspecified: Secondary | ICD-10-CM | POA: Diagnosis not present

## 2024-05-29 DIAGNOSIS — Z79899 Other long term (current) drug therapy: Secondary | ICD-10-CM | POA: Insufficient documentation

## 2024-05-29 DIAGNOSIS — I5021 Acute systolic (congestive) heart failure: Secondary | ICD-10-CM | POA: Diagnosis not present

## 2024-05-29 LAB — URIC ACID: Uric Acid, Serum: 7.2 mg/dL (ref 3.7–8.6)

## 2024-05-29 LAB — DIGOXIN LEVEL: Digoxin Level: <0.4 ng/mL — ABNORMAL LOW (ref 0.8–2.0)

## 2024-05-29 LAB — BRAIN NATRIURETIC PEPTIDE: B Natriuretic Peptide: 195.9 pg/mL — ABNORMAL HIGH (ref 0.0–100.0)

## 2024-05-29 LAB — BASIC METABOLIC PANEL WITH GFR
Anion gap: 11 (ref 5–15)
BUN: 15 mg/dL (ref 6–20)
CO2: 24 mmol/L (ref 22–32)
Calcium: 9.3 mg/dL (ref 8.9–10.3)
Chloride: 105 mmol/L (ref 98–111)
Creatinine, Ser: 0.81 mg/dL (ref 0.61–1.24)
GFR, Estimated: >60 mL/min (ref >60)
Glucose, Bld: 131 mg/dL — ABNORMAL HIGH (ref 70–99)
Potassium: 3.8 mmol/L (ref 3.5–5.1)
Sodium: 140 mmol/L (ref 135–145)

## 2024-05-29 MED ORDER — HYDRALAZINE HCL 25 MG PO TABS: 25.0000 mg | ORAL_TABLET | Freq: Three times a day (TID) | ORAL | 3 refills | Status: DC

## 2024-05-29 MED ORDER — ISOSORBIDE MONONITRATE ER 30 MG PO TB24: 30.0000 mg | ORAL_TABLET | Freq: Every day | ORAL | 3 refills | Status: DC

## 2024-05-29 MED ORDER — FUROSEMIDE 20 MG PO TABS: 20.0000 mg | ORAL_TABLET | ORAL | 1 refills | Status: DC

## 2024-05-29 MED ORDER — IVABRADINE HCL 5 MG PO TABS: 5.0000 mg | ORAL_TABLET | Freq: Two times a day (BID) | ORAL | 5 refills | Status: AC

## 2024-05-29 NOTE — Progress Notes (Signed)
 [facility] CLIN[facility] NOTE  Reason for Visit/CC: f/u for systolic heart failure   HPI:  Christopher Hays is a 38 y.o. male with type 2DM, HTN, HLD, and untreated OSA admitted w/ new systolic heart failure on 04/20/24.   Echo with EF 30-35%, normal RV. RHC/LHC 5/25: with minimal CAD, elevated filling pressures, CI 2.6 T/3.1 F.     Cardiac MRI 5/25: LVEF 27% RV 29%.  Moderate circumferential pericardial effusion without tamponade. There is mid-wall LGE in the basal anteroseptal wall and the basal to mid inferoseptal wall suggestive of prior myocarditis or possibly an infiltrative disease such as cardiac sarcoidosis. Elevated ECV 38% suggesting elevated myocardial fibrotic content.  He has done fairly well post d/c. We have been gradually up titrating meds.   He returns today for f/u and further med titration. Here w/ his wife.   Wt continues to trend down. Down 5 lb from previous visit. Reports stable NYHA Class I-II symptoms. No orthopnea/PND. Compliant w/ meds and tolerating well. No side effects. BP elevated 142/108. EKG shows sinus tach 99 bpm. He did not pick up corlanor after last visit. He was told at pharmacy it would be expensive.   His only complaint is rt knee and wrist pain. Feels c/w previous gout flares. Knee swollen and warm.    ROS: All systems negative except as listed in HPI, PMH and Problem List.  SH:  Social History   Socioeconomic History   Marital status: Married    Spouse name: Not on file   Number of children: Not on file   Years of education: Not on file   Highest education level: Not on file  Occupational History   Occupation: unemployed due to covid  Tobacco Use   Smoking status: Never   Smokeless tobacco: Never  Vaping Use   Vaping status: Never Used  Substance and Sexual Activity   Alcohol use: Yes    Comment: occasional    Drug use: No   Sexual activity: Not on file  Other Topics Concern   Not on file  Social History Narrative   Not on file    Social Drivers of Health   Financial Resource Strain: High Risk (09/03/2021)   Received from [facility]   Overall Financial Resource Strain (CARDIA)    Difficulty of Paying Living Expenses: Very hard  Food Insecurity: No Food Insecurity (04/21/2024)   Hunger Vital Sign    Worried About Running Out of Food in the Last Year: Never true    Ran Out of Food in the Last Year: Never true  Transportation Needs: No Transportation Needs (04/21/2024)   PRAPARE - Administrator, Civil Service (Medical): No    Lack of Transportation (Non-Medical): No  Physical Activity: Insufficiently Active (09/03/2021)   [facility]   Exercise Vital Sign    On average, how many days per week do you engage in moderate to strenuous exercise (like a brisk walk)?: 3 days    On average, how many minutes do you engage in exercise at this level?: 30 min  Stress: Stress Concern Present (09/03/2021)   Received from [facility] of Occupational [facility] - Occupational Stress Questionnaire    Feeling of Stress : Rather much  Social Connections: Unknown (04/19/2022)   Received from [facility]   Social Network    Social Network: Not on file  Intimate Partner Violence: Not At Risk (04/21/2024)   Humiliation, Afraid, Rape, and Kick questionnaire  Fear of Current or Ex-Partner: No    Emotionally Abused: No    Physically Abused: No    Sexually Abused: No    FH:  Family History  Problem Relation Age of Onset   Gout Paternal Grandmother    Gout Paternal Grandfather    Diabetes Neg Hx     Past Medical History:  Diagnosis Date   Diabetes mellitus without complication (HCC)    Gout    Hypertension    Sleep apnea     Current Outpatient Medications  Medication Sig Dispense Refill   albuterol (VENTOLIN HFA) 108 (90 Base) MCG/ACT inhaler Inhale 2 puffs into the lungs every 6 (six) hours as needed for shortness of breath.     allopurinol  (ZYLOPRIM ) 100 MG tablet Take  100 mg by mouth daily.  0   blood glucose meter kit and supplies Dispense based on patient and insurance preference. Use up to four times daily as directed. (FOR ICD-10 E10.9, E11.9). 1 each 0   Blood Glucose Monitoring Suppl DEVI 1 each by Does not apply route in the morning, at noon, and at bedtime. May substitute to any manufacturer covered by patient's insurance. 1 each 0   carvedilol  (COREG ) 3.125 MG tablet Take 1 tablet (3.125 mg total) by mouth 2 (two) times daily with a meal. 60 tablet 1   digoxin  (LANOXIN ) 0.125 MG tablet Take 1 tablet (0.125 mg total) by mouth daily. 30 tablet 1   Dulaglutide  (TRULICITY ) 1.5 MG/0.5ML SOAJ Inject 1.5 mg into the skin once a week. 2 mL 2   empagliflozin  (JARDIANCE ) 10 MG TABS tablet Take 1 tablet (10 mg total) by mouth daily. 30 tablet 1   Ferrous Sulfate  (IRON ) 90 (18 Fe) MG TABS Take 1 tablet by mouth daily after breakfast. 30 tablet 1   furosemide  (LASIX ) 20 MG tablet Take 1 tablet (20 mg total) by mouth daily. 30 tablet 1   Insulin  Pen Needle 31G X 5 MM MISC Use with insulin  pen 100 each 1   ivabradine  (CORLANOR) 5 MG TABS tablet Take 1 tablet (5 mg total) by mouth 2 (two) times daily with a meal. 60 tablet 5   rosuvastatin  (CRESTOR ) 20 MG tablet Take 20 mg by mouth at bedtime.     sacubitril -valsartan  (ENTRESTO ) 97-103 MG Take 1 tablet by mouth 2 (two) times daily. 60 tablet 1   spironolactone  (ALDACTONE ) 25 MG tablet Take 1 tablet (25 mg total) by mouth daily. 30 tablet 1   TOUJEO  SOLOSTAR 300 UNIT/ML Solostar Pen Inject 25 Units into the skin at bedtime. 10 mL 2   VITAMIN D PO Take 1 tablet by mouth daily.     No current facility-administered medications for this encounter.    Vitals:   05/29/24 1125  BP: (!) 142/108  Pulse: (!) 104  SpO2: 98%  Weight: 119.6 kg (263 lb 9.6 oz)   Wt Readings from Last 3 Encounters:  05/29/24 119.6 kg (263 lb 9.6 oz)  05/10/24 121.8 kg (268 lb 9.6 oz)  04/30/24 122.9 kg (271 lb)     PHYSICAL  EXAM:  General:  Well appearing. No respiratory difficulty HEENT: normal Neck: supple. no JVD. Carotids 2+ bilat; no bruits. No lymphadenopathy or thyromegaly appreciated. Cor: PMI nondisplaced. Regular rate & rhythm. No rubs, gallops or murmurs. Lungs: clear Abdomen: soft, nontender, nondistended. No hepatosplenomegaly. No bruits or masses. Good bowel sounds. Extremities: no cyanosis, clubbing, rash, edema Neuro: alert & oriented x 3, cranial nerves grossly intact. moves all 4 extremities w/o  difficulty. Affect pleasant.  ECG: NSR 99 bpm Personally reviewed     ASSESSMENT & PLAN:   1. 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 - RHC/LHC 5/25: with minimal CAD, elevated filling pressures, CI 2.6 T/3.1 F.     - cardiac MRI 5/25: LVEF 27% RV 29%.  Moderate circumferential pericardial effusion without tamponade. There is mid-wall LGE in the basal anteroseptal wall and the basal to mid inferoseptal wall suggestive of prior myocarditis or possibly an infiltrative disease such as cardiac sarcoidosis. Elevated ECV 38% suggesting elevated myocardial fibrotic content. - NYHA I-II - Volume, euvolemic- dry. Reduce Lasix  from daily to 2 days a wk  - Continue Jardiance  10 daily - Continue Entresto  97-103 mg bid  - Continue Coreg  25 mg bid  - Continue Spironolactone  25 mg daily  - He did not pick up Corlanor, was told cost would be too high. Pharm Tech just checked insurance and Copay is $4. Start 5 mg bid  - Continue Digoxin  0.125. Check Dig level  - Add hydralazine  25 mg tid + Imdur 30 mg daily for additional afterload reduction  - PET scan to evalaute for sarcoidosis ordered. Needs to complete (waiting on insurance authorization)  - No FHX to suggest genetic CM - Check BMP and BNP today  2. HTN:  - mild-moderately elevated despite current regimen  - GDMT per above + start hydral/Imdur  3. OSA:  - he discontinued CPAP > 1 year ago. Just re-established  care w/ sleep clinic. Pending split night study for new CPAP device  4. Obesity: Body mass index is 35.75 kg/m. - refer for GLP1RA. Send to PharmD clinic  5. Type 2DM - recent Hgb A1c 7.6 - on SGLT2i  - would benefit from GLP1 given concomitant OSA, obesity and HF  6. Knee Pain: suspect acute gout.  - check uric acid - if elevated, will treat w/ prednisone  burst 40 mg daily x 3 days  - reducing loop diuretic dosing - continue allopurinol     F/u w/ pharm D in 2-3 wks for further med titration. Echo w/ APP/MD f/u in 2 months.    Ruddy Corral, PA-C  11:38 AM

## 2024-05-29 NOTE — Patient Instructions (Addendum)
 Good to see you today!  START Hydralazine  25 mg Three times a day   START Imdur 30 mg daily  DECREASE Lasix   to 2 x a week  RESTART Corlanor 5 mg Twice daily  Please follow up with our heart failure pharmacist  as scheduled  Labs done today, your results will be available in MyChart, we will contact you for abnormal readings. Your physician has requested that you have an echocardiogram. Echocardiography is a painless test that uses sound waves to create images of your heart. It provides your doctor with information about the size and shape of your heart and how well your heart's chambers and valves are working. This procedure takes approximately one hour. There are no restrictions for this procedure. Please do NOT wear cologne, perfume, aftershave, or lotions (deodorant is allowed). Please arrive 15 minutes prior to your appointment time.  Please note: We ask at that you not bring children with you during ultrasound (echo/ vascular) testing. Due to room size and safety concerns, children are not allowed in the ultrasound rooms during exams. Our front office staff cannot provide observation of children in our lobby area while testing is being conducted. An adult accompanying a patient to their appointment will only be allowed in the ultrasound room at the discretion of the ultrasound technician under special circumstances. We apologize for any inconvenience.  Your physician recommends that you schedule a follow-up appointment as scheduled  If you have any questions or concerns before your next appointment please send us  a message through [facility] or call our office at [phone].    TO LEAVE A MESSAGE FOR THE NURSE SELECT OPTION 2, PLEASE LEAVE A MESSAGE INCLUDING: YOUR NAME DATE OF BIRTH CALL BACK NUMBER REASON FOR CALL**this is important as we prioritize the call backs  YOU WILL RECEIVE A CALL BACK THE SAME DAY AS LONG AS YOU CALL BEFORE 4:00 PM At the [facility],  you and your health needs are our priority. As part of our continuing mission to provide you with exceptional heart care, we have created designated Provider Care Teams. These Care Teams include your primary Cardiologist (physician) and Advanced Practice Providers (APPs- Physician Assistants and Nurse Practitioners) who all work together to provide you with the care you need, when you need it.   You may see any of the following providers on your designated Care Team at your next follow up: Dr Jules Oar Dr Peder Bourdon Dr. Alwin Baars Dr. Arta Lark Amy Marijane Shoulders, NP Ruddy Corral, [place] [facility] [place], [place] Dennise Fitz, NP [place] Lee, NP Shawnee Dellen, NP Luster Salters, PharmD Bevely Brush, PharmD   Please be sure to bring in all your medications bottles to every appointment.    Thank you for choosing [facility]

## 2024-05-31 MED ORDER — PREDNISONE 20 MG PO TABS
40.0000 mg | ORAL_TABLET | Freq: Every day | ORAL | 0 refills | Status: AC
Start: 2024-05-31 — End: 2024-06-03

## 2024-05-31 NOTE — Telephone Encounter (Signed)
Pt aware.

## 2024-06-08 ENCOUNTER — Encounter: Attending: Internal Medicine | Admitting: Dietician

## 2024-06-08 ENCOUNTER — Encounter: Payer: Self-pay | Admitting: Dietician

## 2024-06-08 VITALS — Ht 72.0 in | Wt 264.0 lb

## 2024-06-08 DIAGNOSIS — R809 Proteinuria, unspecified: Secondary | ICD-10-CM | POA: Insufficient documentation

## 2024-06-08 DIAGNOSIS — I509 Heart failure, unspecified: Secondary | ICD-10-CM | POA: Insufficient documentation

## 2024-06-08 DIAGNOSIS — Z794 Long term (current) use of insulin: Secondary | ICD-10-CM | POA: Insufficient documentation

## 2024-06-08 DIAGNOSIS — E1129 Type 2 diabetes mellitus with other diabetic kidney complication: Secondary | ICD-10-CM | POA: Diagnosis present

## 2024-06-08 NOTE — Patient Instructions (Addendum)
 Consider the Dexcom G7 or Libre 3+  Avoid processed foods Continue your herb mix to season Read labels for sodium, fat, saturated fat, added sugar Have a plan for eating out - read the labels for the menu for sodium, ask for foods to be prepared without salt Increase your plants. Decreased high purine foods due to gout Balance your meals (variety and increased balance)  Low sodium options: [employer] Silver Palate low sodium pasta sauce

## 2024-06-08 NOTE — Progress Notes (Signed)
 Diabetes Self-Management Education  Visit Type: First/Initial  Appt. Start Time: 0915 Appt. End Time: 1030  06/08/2024  [name], identified by name and date of birth, is a 38 y.o. male with a diagnosis of Diabetes: Type 2.   ASSESSMENT Patient is here today with his wife. He is not checking his blood glucose.  Blood glucose today in the office was 146.  Referral:  New onset CHF and Type 2 Diabetes (Dr. Sigurd Pac)  He states that he is going to have a sleep study next week to reevaluate his mask.  History includes:  Type 2 Diabetes (2020), CHF (2025), OSA (occasional c-pap due to comfort), gout Medications include:  Lasix , spironolactone , trulicity , jardiance , toujeo  26 units q HS, vitamin D Labs noted to include:  A1C 7.6% 04/19/2024 and 12.9% in 2020  72 264 lbs  Patient lives with his wife and 3 children.  They share shopping and cooking. He is currently out on Short Term disability from [employer] (first shift). Not exercising due to increased gout attacks. Avoids red meat most frequently. Eats 2 meals per day. Height 6' (1.829 m), weight 264 lb (119.7 kg). Body mass index is 35.8 kg/m.   Diabetes Self-Management Education - 06/08/24 0925       Visit Information   Visit Type First/Initial      Initial Visit   Diabetes Type Type 2    Date Diagnosed 2021    Are you currently following a meal plan? No    Are you taking your medications as prescribed? Yes      Health Coping   How would you rate your overall health? Poor      Psychosocial Assessment   Patient Belief/Attitude about Diabetes Defeat/Burnout    What is the hardest part about your diabetes right now, causing you the most concern, or is the most worrisome to you about your diabetes?   Taking/obtaining medications;Checking blood sugar    Self-care barriers Debilitated state due to current medical condition    Self-management support Doctor's office;Family    Other persons present  Patient;Spouse/SO    Patient Concerns Nutrition/Meal planning;Glycemic Control;Weight Control;Healthy Lifestyle    Special Needs None    Preferred Learning Style No preference indicated    Learning Readiness Ready    How often do you need to have someone help you when you read instructions, pamphlets, or other written materials from your doctor or pharmacy? 1 - Never    What is the last grade level you completed in school? 12      Pre-Education Assessment   Patient understands the diabetes disease and treatment process. Needs Review    Patient understands incorporating nutritional management into lifestyle. Needs Review    Patient undertands incorporating physical activity into lifestyle. Needs Review    Patient understands using medications safely. Needs Review    Patient understands monitoring blood glucose, interpreting and using results Needs Review    Patient understands prevention, detection, and treatment of acute complications. Needs Review    Patient understands prevention, detection, and treatment of chronic complications. Needs Review    Patient understands how to develop strategies to address psychosocial issues. Needs Review    Patient understands how to develop strategies to promote health/change behavior. Needs Review      Complications   Last HgB A1C per patient/outside source 7.6 %   04/2024   How often do you check your blood sugar? 0 times/day (not testing)    Fasting Blood glucose range (mg/dL)  130-179    Number of hypoglycemic episodes per month 0    Number of hyperglycemic episodes ( >200mg /dL): Daily    Can you tell when your blood sugar is high? Yes    What do you do if your blood sugar is high? nothing    Have you had a dilated eye exam in the past 12 months? Yes    Have you had a dental exam in the past 12 months? No    Are you checking your feet? Yes    How many days per week are you checking your feet? 3      Dietary Intake   Breakfast grits, fruit     Snack (morning) none    Lunch none yesterday    Snack (afternoon) none    Dinner [employer] - steak alphredo pasta    Snack (evening) none    Beverage(s) water      Activity / Exercise   Activity / Exercise Type ADL's      Patient Education   Previous Diabetes Education Yes (please comment)   in the hospital   Disease Pathophysiology Definition of diabetes, type 1 and 2, and the diagnosis of diabetes    Healthy Eating Role of diet in the treatment of diabetes and the relationship between the three main macronutrients and blood glucose level;Food label reading, portion sizes and measuring food.;Plate Method;Information on hints to eating out and maintain blood glucose control.;Meal options for control of blood glucose level and chronic complications.;Other (comment)   low sodium   Medications Reviewed patients medication for diabetes, action, purpose, timing of dose and side effects.    Monitoring Identified appropriate SMBG and/or A1C goals.;Daily foot exams;Yearly dilated eye exam;Purpose and frequency of SMBG.    Acute complications Taught prevention, symptoms, and  treatment of hypoglycemia - the 15 rule.;Discussed and identified patients' prevention, symptoms, and treatment of hyperglycemia.    Chronic complications Relationship between chronic complications and blood glucose control;Assessed and discussed foot care and prevention of foot problems;Retinopathy and reason for yearly dilated eye exams;Identified and discussed with patient  current chronic complications    Diabetes Stress and Support Identified and addressed patients feelings and concerns about diabetes;Worked with patient to identify barriers to care and solutions;Role of stress on diabetes      Individualized Goals (developed by patient)   Nutrition General guidelines for healthy choices and portions discussed    Physical Activity Exercise 5-7 days per week;30 minutes per day    Medications take my medication as prescribed     Monitoring  Test my blood glucose as discussed    Problem Solving Eating Pattern;Addressing barriers to behavior change    Reducing Risk examine blood glucose patterns;do foot checks daily;treat hypoglycemia with 15 grams of carbs if blood glucose less than 70mg /dL;check ketones if blood glucose over 240mg /dL      Post-Education Assessment   Patient understands the diabetes disease and treatment process. Comprehends key points    Patient understands incorporating nutritional management into lifestyle. Comprehends key points    Patient undertands incorporating physical activity into lifestyle. Comprehends key points    Patient understands using medications safely. Comphrehends key points    Patient understands monitoring blood glucose, interpreting and using results Comprehends key points    Patient understands prevention, detection, and treatment of acute complications. Comprehends key points    Patient understands prevention, detection, and treatment of chronic complications. Comprehends key points    Patient understands how to develop strategies to address psychosocial  issues. Comprehends key points    Patient understands how to develop strategies to promote health/change behavior. Comprehends key points      Outcomes   Expected Outcomes Demonstrated interest in learning. Expect positive outcomes    Future DMSE PRN    Program Status Completed          Individualized Plan for Diabetes Self-Management Training:   Learning Objective:  Patient will have a greater understanding of diabetes self-management. Patient education plan is to attend individual and/or group sessions per assessed needs and concerns.   Plan:   Patient Instructions  Consider the Dexcom G7 or Libre 3+  Avoid processed foods Continue your herb mix to season Read labels for sodium, fat, saturated fat, added sugar Have a plan for eating out - read the labels for the menu for sodium, ask for foods to be prepared  without salt Increase your plants. Decreased high purine foods due to gout Balance your meals (variety and increased balance)  Low sodium options: [employer] Palate low sodium pasta sauce  Expected Outcomes:  Demonstrated interest in learning. Expect positive outcomes  Education material provided: ADA - How to Thrive: A Guide for Your Journey with Diabetes, Food label handouts, Meal plan card, Snack sheet, and Diabetes Resources, Low purine nutrition therapy from AND, Carbohydrate consistent heart healthy nutrition therapy from AND  If problems or questions, patient to contact team via:  Phone  Future DSME appointment: PRN

## 2024-06-11 ENCOUNTER — Other Ambulatory Visit (HOSPITAL_COMMUNITY): Payer: Self-pay | Admitting: Cardiology

## 2024-06-11 ENCOUNTER — Telehealth (HOSPITAL_COMMUNITY): Payer: Self-pay | Admitting: Cardiology

## 2024-06-11 NOTE — Telephone Encounter (Signed)
 Returned call from triage VM with concerns for gout flare  Pt reports he has been dealing with gout flare off and on since may -currently in urgent care -aware further management will come from PCP -pt aware of 6/17 results, pt received prednisone  RX  Pt also requested letter for employer Letter needed for missed work during hospitalization 04/19/24-04/23/24 and pt reports he was not doing well last week 06/04/24-06/08/24. Reports increase in fatigue/dizziness with recent medication changes plus gout flare caused him to stay home  -ok to provide letter

## 2024-06-12 ENCOUNTER — Ambulatory Visit: Attending: Sleep Medicine

## 2024-06-12 DIAGNOSIS — G4733 Obstructive sleep apnea (adult) (pediatric): Secondary | ICD-10-CM | POA: Diagnosis present

## 2024-06-14 NOTE — Telephone Encounter (Signed)
 Pt reports letter for hospitalization

## 2024-06-19 ENCOUNTER — Encounter (HOSPITAL_COMMUNITY): Payer: Self-pay | Admitting: *Deleted

## 2024-06-19 NOTE — Telephone Encounter (Signed)
 Letter completed and left for pt at front desk, he will pick up 7/9 and drop off his disability forms for completion

## 2024-06-20 ENCOUNTER — Ambulatory Visit (INDEPENDENT_AMBULATORY_CARE_PROVIDER_SITE_OTHER): Payer: Self-pay | Admitting: Sleep Medicine

## 2024-06-20 DIAGNOSIS — G4733 Obstructive sleep apnea (adult) (pediatric): Secondary | ICD-10-CM

## 2024-06-20 NOTE — Telephone Encounter (Signed)
 Please notify patient that PSG revealed moderate OSA, recommend proceeding with APAP therapy set to 4-16 cm H2O, EPR 3 with the Airtouch N30i nasal mask. Please also schedule a 3 month follow up visit if patient wishes to proceed with CPAP therapy. Thanks

## 2024-06-20 NOTE — Progress Notes (Incomplete)
 ***In Progress***    Advanced Heart Failure Clinic Note   HPI:  Christopher Hays is a 38 y.o. male with type 2DM, HTN, HLD, and untreated OSA admitted w/ new systolic heart failure on 04/20/2024.   During admission on 04/2024 ECHO with LVEF 30-35%, global hypokinesis, moderate LVH, normal RV. RHC/LHC with minimal CAD, elevated filling pressures, CI 2.6 T/3.1 F.     Cardiac MRI 04/23/24: LVEF 27% RV 29%.  Moderate circumferential pericardial effusion without tamponade. There is mid-wall LGE in the basal anteroseptal wall and the basal to mid inferoseptal wall suggestive of prior myocarditis or possibly an infiltrative disease such as cardiac sarcoidosis. Elevated ECV 38% suggesting elevated myocardial fibrotic content. Upon discharge he was taking carvedilol  3.125 mg BID, Entresto  97/103 mg BID, spironolactone  25 mg daily, Jardiance  10 mg daily, digoxin  0.125 mg daily, furosemide  20 mg daily.   04/30/2024 follow up with MD, reported doing well. Compliant with medications and no low BP. Ivabradine  5 mg BID was initiated.  05/29/2024 follow up with APP, weight was noted to be trending down, 5 lbs down since last visit. Denied orthopnea/PND. Said that he was compliant with medications but had not started ivabradine  due to cost. Pharmacy technician checked and co-pay was found to be $4. Only complaint was right knee and wrist pain consistent with previous gout flares. Was told to start ivabradine  5 mg BID. Additionally, was started on hydralazine  25 mg TID and isosorbide  mononitrate 30 mg daily. Furosemide  changed from 20 mg daily to 20 mg twice a week.   Today he returns to HF clinic for pharmacist medication titration. At last visit with APP, was started on  ivabradine  5 mg BID, hydralazine  25 mg TID, and isosorbide  mononitrate 30 mg daily.Furosemide  changed from 20 mg daily to 20 mg twice a week.    ***.   Overall feeling ***. Dizziness, lightheadedness, fatigue:  Chest pain or palpitations:  How is your  breathing?: *** SOB: Able to complete all ADLs. Activity level ***  Weight at home pounds. Takes furosemide /torsemide/bumex *** mg *** daily.  LEE PND/Orthopnea  Appetite *** Low-salt diet:   Physical Exam Cost/affordability of meds   HF Medications: - has not refilled any of original GDMT from d/c since 5/12 (30 ds). Ivabradine , hydral, isosorbide  filled 6/17.  Carvedilol  3.125 mg BID Entresto  97/103 mg BID Spironolactone  25 mg daily  Jardiance  10 mg daily  Digoxin  0.125 mg daily Ivabradine  5 mg BID  Hydralazine  25 mg TID Isosorbide  mononitrate 30 mg daily  Furosemide  20 mg twice a week  Has the patient been experiencing any side effects to the medications prescribed?  {YES NO:22349}  Does the patient have any problems obtaining medications due to transportation or finances?   {YES WN:77650} *** Patient has [employer] and [employer] PPO and [place] Medicaid Prepaid Healthy [employer]   Understanding of regimen: {excellent/good/fair/poor:19665} Understanding of indications: {excellent/good/fair/poor:19665} Potential of compliance: {excellent/good/fair/poor:19665} Patient understands to avoid NSAIDs. Patient understands to avoid decongestants.    Pertinent Lab Values: 05/29/2024: Serum creatinine 0.81, BUN 15, Potassium 3.8, Sodium 140, BNP 195.9  Vital Signs: Weight: *** lbs (last clinic weight: 263.6 lbs) Blood pressure: *** mmHg Heart rate: *** bpm  Assessment/Plan: 1. 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 - RHC/LHC 04/2024: with minimal CAD, elevated filling pressures, CI 2.6 T/3.1 F.     - Cardiac MRI 5/25: LVEF 27% RV 29%.  Moderate circumferential pericardial effusion without tamponade. There is mid-wall LGE  in the basal anteroseptal wall and the basal to mid inferoseptal wall suggestive of prior myocarditis or possibly an infiltrative disease such as cardiac sarcoidosis. Elevated ECV 38% suggesting elevated myocardial fibrotic  content. - NYHA I-II - Volume, euvolemic- dry. Reduce Lasix  from daily to 2 days a wk  - Continue carvedilol  3.125 mg BID - Continue Entresto  97/103 mg BID - Continue spironolactone  25 mg daily  - Continue Jardiance  10 mg daily  - Continue digoxin  0.125 mg daily - Continue ivabradine  5 mg BID  - Continue hydralazine  25 mg TID - Continue isosorbide  mononitrate 30 mg daily  - Continue furosemide  20 mg twice a week - PET scan to evalaute for sarcoidosis ordered. Needs to complete (waiting on insurance authorization)   2. HTN:  - BP *** - Continue Entresto  97/103 mg BID - Continue hydralazine  25 mg TID - Continue isosorbide  mononitrate 30 mg daily  3. OSA:  - He discontinued CPAP > 1 year ago. Just re-established care w/ sleep clinic. Pending split night study for new CPAP device.   4. Obesity: - Referral for GLP1 previously noted  5. Type 2DM - recent Hgb A1c 7.6 - Continue Jardiance  10 mg daily  6. Knee Pain: suspect acute gout.  - Continue furosemide  20 mg twice a week - Continue allopurinol    Follow up ***   Tinnie Redman, PharmD, BCPS, BCCP, CPP Heart Failure Clinic Pharmacist [phone]

## 2024-06-21 ENCOUNTER — Ambulatory Visit (HOSPITAL_COMMUNITY)
Admission: RE | Admit: 2024-06-21 | Discharge: 2024-06-21 | Disposition: A | Discharge status: Home / Self Care | Source: Ambulatory Visit | Attending: Cardiology | Admitting: Cardiology

## 2024-06-21 VITALS — BP 124/86 | HR 84 | Wt 255.8 lb

## 2024-06-21 DIAGNOSIS — I5022 Chronic systolic (congestive) heart failure: Secondary | ICD-10-CM

## 2024-06-21 DIAGNOSIS — I5021 Acute systolic (congestive) heart failure: Secondary | ICD-10-CM | POA: Diagnosis present

## 2024-06-21 DIAGNOSIS — E669 Obesity, unspecified: Secondary | ICD-10-CM | POA: Diagnosis not present

## 2024-06-21 DIAGNOSIS — Z7984 Long term (current) use of oral hypoglycemic drugs: Secondary | ICD-10-CM | POA: Diagnosis not present

## 2024-06-21 DIAGNOSIS — E785 Hyperlipidemia, unspecified: Secondary | ICD-10-CM | POA: Diagnosis not present

## 2024-06-21 DIAGNOSIS — E119 Type 2 diabetes mellitus without complications: Secondary | ICD-10-CM | POA: Diagnosis not present

## 2024-06-21 DIAGNOSIS — Z794 Long term (current) use of insulin: Secondary | ICD-10-CM | POA: Diagnosis not present

## 2024-06-21 DIAGNOSIS — I110 Hypertensive heart disease with heart failure: Secondary | ICD-10-CM | POA: Insufficient documentation

## 2024-06-21 DIAGNOSIS — G4733 Obstructive sleep apnea (adult) (pediatric): Secondary | ICD-10-CM | POA: Insufficient documentation

## 2024-06-21 DIAGNOSIS — Z7985 Long-term (current) use of injectable non-insulin antidiabetic drugs: Secondary | ICD-10-CM | POA: Diagnosis not present

## 2024-06-21 DIAGNOSIS — Z79899 Other long term (current) drug therapy: Secondary | ICD-10-CM | POA: Diagnosis not present

## 2024-06-21 DIAGNOSIS — M25569 Pain in unspecified knee: Secondary | ICD-10-CM | POA: Diagnosis not present

## 2024-06-21 DIAGNOSIS — I428 Other cardiomyopathies: Secondary | ICD-10-CM | POA: Diagnosis not present

## 2024-06-21 MED ORDER — CARVEDILOL 6.25 MG PO TABS: 6.2500 mg | ORAL_TABLET | Freq: Two times a day (BID) | ORAL | 5 refills | Status: AC

## 2024-06-21 MED ORDER — EMPAGLIFLOZIN 10 MG PO TABS: 10.0000 mg | ORAL_TABLET | Freq: Every day | ORAL | 5 refills | Status: AC

## 2024-06-21 MED ORDER — SACUBITRIL-VALSARTAN 97-103 MG PO TABS
1.0000 | ORAL_TABLET | Freq: Two times a day (BID) | ORAL | 5 refills | Status: AC
Start: 2024-06-21 — End: ?

## 2024-06-21 MED ORDER — FUROSEMIDE 20 MG PO TABS: 20.0000 mg | ORAL_TABLET | Freq: Every day | ORAL | 5 refills | Status: AC | PRN

## 2024-06-21 MED ORDER — ROSUVASTATIN CALCIUM 20 MG PO TABS: 20.0000 mg | ORAL_TABLET | Freq: Every day | ORAL | 5 refills | Status: AC

## 2024-06-21 MED ORDER — DIGOXIN 125 MCG PO TABS: 0.1250 mg | ORAL_TABLET | Freq: Every day | ORAL | 5 refills | Status: AC

## 2024-06-21 MED ORDER — SPIRONOLACTONE 25 MG PO TABS: 25.0000 mg | ORAL_TABLET | Freq: Every day | ORAL | 5 refills | Status: AC

## 2024-06-21 NOTE — Patient Instructions (Signed)
 It was a pleasure seeing you today!  MEDICATIONS: -We are changing your medications today -Increase carvedilol  to 6.25 mg (1 tablet) twice daily -Decrease Lasix  (furosemide ) to only take it when needed for extra fluid -Stop hydralazine  and isosorbide  mononitrate -Call if you have questions about your medications.   NEXT APPOINTMENT: Return to clinic in 1 month with APP [facility].  In general, to take care of your heart failure: -Limit your fluid intake to 2 Liters (half-gallon) per day.   -Limit your salt intake to ideally 2-3 grams (2000-3000 mg) per day. -Weigh yourself daily and record, and bring that weight diary to your next appointment.  (Weight gain of 2-3 pounds in 1 day typically means fluid weight.) -The medications for your heart are to help your heart and help you live longer.   -Please contact us  before stopping any of your heart medications.  Call the clinic at [phone] with questions or to reschedule future appointments.

## 2024-06-21 NOTE — Progress Notes (Signed)
 Advanced Heart Failure Clinic Note   PCP: [facility]  AH[facility] Cardiologist: Dr. Cherrie   HPI:  Christopher Hays is a 38 y.o. male with T 2DM, HTN, HLD, and untreated OSA admitted w/ new systolic heart failure on 04/20/2024.   During admission on 04/2024 ECHO with LVEF 30-35%, global hypokinesis, moderate LVH, normal RV. RHC/LHC with minimal CAD, elevated filling pressures, CI 2.6 T/3.1 F.     Cardiac MRI 04/23/24: LVEF 27% RV 29%.  Moderate circumferential pericardial effusion without tamponade. There is mid-wall LGE in the basal anteroseptal wall and the basal to mid inferoseptal wall suggestive of prior myocarditis or possibly an infiltrative disease such as cardiac sarcoidosis. Elevated ECV 38% suggesting elevated myocardial fibrotic content. Upon discharge he was taking carvedilol  3.125 mg BID, Entresto  97/103 mg BID, spironolactone  25 mg daily, Jardiance  10 mg daily, digoxin  0.125 mg daily, furosemide  20 mg daily.   04/30/2024 follow up with MD, reported doing well. Compliant with medications and no low BP. Ivabradine  5 mg BID was initiated.  05/29/2024 follow up with APP, weight was noted to be trending down, 5 lbs down since last visit. Denied orthopnea/PND. Said that he was compliant with medications but had not started ivabradine  due to cost (was told at his pharmacy that the cost would likely be high). Pharmacy technician checked and co-pay was found to be $4. Only complaint was right knee and wrist pain consistent with previous gout flares. Was told to start ivabradine  5 mg BID. Additionally, was started on hydralazine  25 mg TID and isosorbide  mononitrate 30 mg daily. Furosemide  changed from 20 mg daily to 20 mg twice a week.   Today he returns to HF clinic for pharmacist medication titration. Overall today he says he is feeling fine. Endorses some dizziness, lightheadedness. Says this happens a few times a week and episodes typically occur when he goes from sitting to  standing. Episodes last ~ 10 minutes and go away after he sits down and relaxes. Says his breathing has been good, although has had a lower level of activity as to not over exert himself. Denies SOB. He has been intentionally trying to lose weight with success. No LEE. Denies PND / orthopnea. Appetite has been good, and had been adhering to low-salt diet. During visit, patient said he had difficulty taking his medications consistently immediately following discharge but has been better lately. He mentions that he is now about to run out of carvedilol , Jardiance , and Entresto . Dispense history does not show that these have been filled since original discharge in May (04/23/24). Did not bring medications in to clinic appointment today. I am concerned that since these medications were not sent to his [facility] follow up appointments (last filled at [facility]) that compliance may be overestimated per his report. Confirmed with patient that [facility] [address].   HF Medications: Carvedilol  3.125 mg BID  Entresto  97/103 mg BID   Spironolactone  25 mg daily  Jardiance  10 mg daily  Hydralazine  25 mg TID Isosorbide  mononitrate 30 mg daily  Digoxin  0.125 mg daily Ivabradine  5 mg BID  Furosemide  20 mg twice a week  Has the patient been experiencing any side effects to the medications prescribed? Endorses some episodes of orthostasis since previous visit.   Does the patient have any problems obtaining medications due to transportation or finances?   No. Patient has [employer] and [employer] PPO and  Medicaid Prepaid Healthy Blue Plan   Understanding of regimen:  fair Understanding of indications: fair Potential of compliance: poor Patient understands to avoid NSAIDs. Patient understands to avoid decongestants.  Pertinent Lab Values: 05/29/2024: Serum creatinine 0.81, BUN 15, Potassium 3.8, Sodium 140, BNP 195.9  Vital Signs: Weight: 255.8 lbs (last clinic weight: 263.6 lbs) Blood pressure:  124/86 mmHg Heart rate: 84 bpm  Assessment/Plan: 1. 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 - RHC/LHC 04/2024: with minimal CAD, elevated filling pressures, CI 2.6 T/3.1 F.     - Cardiac MRI 5/25: LVEF 27% RV 29%.  Moderate circumferential pericardial effusion without tamponade. There is mid-wall LGE in the basal anteroseptal wall and the basal to mid inferoseptal wall suggestive of prior myocarditis or possibly an infiltrative disease such as cardiac sarcoidosis. Elevated ECV 38% suggesting elevated myocardial fibrotic content. - NYHA II. Appears euvolemic on exam - Decrease furosemide  20 mg to PRN only - Increase carvedilol  to 6.25 mg BID - Continue Entresto  97/103 mg BID - Continue spironolactone  25 mg daily  - Continue Jardiance  10 mg daily  - Continue digoxin  0.125 mg daily - Continue ivabradine  5 mg BID  - Stop hydralazine  25 mg TID and isosorbide  mononitrate 30 mg daily. Concerned with compliance based on fill history although patient report indicates he has been doing better recently. Would like to confirm he has access to Entresto  and is tolerating the high dose he is prescribed before considering reinitiating hydral/nitrate in the future. Also have more room to increase carvedilol .    2. HTN:  - BP 124/86 today - Changes as above  3. OSA:  - He discontinued CPAP > 1 year ago. Just re-established care w/ sleep clinic. Split night study showed moderate OSA and recommended proceeding with APAP therapy.   4. Obesity: - Currently taking GLP1 Trulicity   5. Type 2DM - recent Hgb A1c 7.6 - Continue Jardiance , Trulicity  and insulin   6. Knee Pain: suspect acute gout.  - Continue allopurinol    Follow up in 1 month with APP.    Tinnie Redman, PharmD, BCPS, BCCP, CPP Heart Failure Clinic Pharmacist [phone]

## 2024-07-03 ENCOUNTER — Encounter (HOSPITAL_COMMUNITY): Payer: Self-pay

## 2024-07-19 ENCOUNTER — Encounter (HOSPITAL_COMMUNITY): Admission: RE | Admit: 2024-07-19 | Source: Ambulatory Visit

## 2024-07-25 ENCOUNTER — Telehealth (HOSPITAL_COMMUNITY): Payer: Self-pay

## 2024-07-25 NOTE — Telephone Encounter (Signed)
 Called to confirm/remind patient of their appointment at the Advanced Heart Failure Clinic on 07/26/24.   Appointment:   [] Confirmed  [] Left mess   [] No answer/No voice mail  [x] VM Full/unable to leave message  [] Phone not in service

## 2024-07-25 NOTE — Progress Notes (Signed)
 ADVANCED HF CLINIC NOTE  PCP: Dr. Garnette, [facility] HF Cardiologist: Dr. Cherrie  HPI: Christopher Hays is a 38 y.o. male with type 2DM, HTN, HLD, and untreated OSA and HFrEF, diagnosed in May 2025.  Admitted 5/25 with shortness of breath and diagnosed with systolic heart failure. See cardiac studies below. He was started on GDMT and discharged at 121.7 kg.   Has been doing well since hospitalization. GDMT has been titrated at subsequent follow up visits.   Wt continues to trend down. Down 5 lb from previous visit. Reports stable NYHA Class I-II symptoms. No orthopnea/PND. Compliant w/ meds and tolerating well. No side effects. BP elevated 142/108. EKG shows sinus tach 99 bpm. He did not pick up corlanor after last visit. He was told at pharmacy it would be expensive.   His only complaint is rt knee and wrist pain. Feels c/w previous gout flares. Knee swollen and warm.   He returns today for heart failure follow up. Overall feeling well. NYHA I. Denies chest pain, dyspnea, fatigue, palpitations, and dizziness. Able to perform ADLs. Appetite okay. BP elevated today but did not take medications this morning. Reports medication compliance, however reports that he is struggling with his insurance. Question full medication compliance given fill report on some medications.   Cardiac Studies: Echo (8/25): EF 45-50%, severe LVH, G1DD, normal RV Echo (5/25): EF 30-35%, normal RV. R/LHC (5/25): with minimal CAD, elevated filling pressures, CI 2.6 T/3.1 F.     CMR (5/25): LVEF 27% RV 29%. Moderate circumferential pericardial effusion without tamponade. LGE suggestive of prior myocarditis or possible cardiac sarcoid.   ROS: All systems negative except as listed in HPI, PMH and Problem List.  SH:  Social History   Socioeconomic History   Marital status: Married    Spouse name: Not on file   Number of children: Not on file   Years of education: Not on file   Highest education  level: Not on file  Occupational History   Occupation: unemployed due to covid  Tobacco Use   Smoking status: Never   Smokeless tobacco: Never  Vaping Use   Vaping status: Never Used  Substance and Sexual Activity   Alcohol use: Yes    Comment: occasional    Drug use: No   Sexual activity: Not on file  Other Topics Concern   Not on file  Social History Narrative   Not on file   Social Drivers of Health   Financial Resource Strain: High Risk (09/03/2021)   Received from [facility]   Overall Financial Resource Strain (CARDIA)    Difficulty of Paying Living Expenses: Very hard  Food Insecurity: Low Risk  (06/14/2024)   Received from [facility]   Hunger Vital Sign    Within the past 12 months, you worried that your food would run out before you got money to buy more: Never true    Within the past 12 months, the food you bought just didn't last and you didn't have money to get more. : Never true  Transportation Needs: No Transportation Needs (06/14/2024)   Received from [facility]    In the past 12 months, has lack of reliable transportation kept you from medical appointments, meetings, work or from getting things needed for daily living? : No  Physical Activity: Insufficiently Active (09/03/2021)   Received from [facility]   Exercise Vital Sign    On average, how many days per week do you engage in  moderate to strenuous exercise (like a brisk walk)?: 3 days    On average, how many minutes do you engage in exercise at this level?: 30 min  Stress: Stress Concern Present (09/03/2021)   Received from [facility] - Occupational Stress Questionnaire    Feeling of Stress : Rather much  Social Connections: Unknown (04/19/2022)   Received from [facility]   Social Network    Social Network: Not on file  Intimate Partner Violence: Not At Risk (04/21/2024)   Humiliation, Afraid, Rape, and Kick questionnaire    Fear of  Current or Ex-Partner: No    Emotionally Abused: No    Physically Abused: No    Sexually Abused: No    FH:  Family History  Problem Relation Age of Onset   Gout Paternal Grandmother    Gout Paternal Grandfather    Diabetes Neg Hx     Past Medical History:  Diagnosis Date   Diabetes mellitus without complication (HCC)    Gout    Hypertension    Sleep apnea     Current Outpatient Medications  Medication Sig Dispense Refill   albuterol (VENTOLIN HFA) 108 (90 Base) MCG/ACT inhaler Inhale 2 puffs into the lungs every 6 (six) hours as needed for shortness of breath.     allopurinol  (ZYLOPRIM ) 100 MG tablet Take 100 mg by mouth daily.  0   blood glucose meter kit and supplies Dispense based on patient and insurance preference. Use up to four times daily as directed. (FOR ICD-10 E10.9, E11.9). 1 each 0   Blood Glucose Monitoring Suppl DEVI 1 each by Does not apply route in the morning, at noon, and at bedtime. May substitute to any manufacturer covered by patient's insurance. 1 each 0   carvedilol  (COREG ) 6.25 MG tablet Take 1 tablet (6.25 mg total) by mouth 2 (two) times daily with a meal. 60 tablet 5   digoxin  (LANOXIN ) 0.125 MG tablet Take 1 tablet (0.125 mg total) by mouth daily. 30 tablet 5   Dulaglutide  (TRULICITY ) 1.5 MG/0.5ML SOAJ Inject 1.5 mg into the skin once a week. 2 mL 2   empagliflozin  (JARDIANCE ) 10 MG TABS tablet Take 1 tablet (10 mg total) by mouth daily. 30 tablet 5   Ferrous Sulfate  (IRON ) 90 (18 Fe) MG TABS Take 1 tablet by mouth daily after breakfast. 30 tablet 1   furosemide  (LASIX ) 20 MG tablet Take 1 tablet (20 mg total) by mouth daily as needed. 30 tablet 5   Insulin  Pen Needle 31G X 5 MM MISC Use with insulin  pen 100 each 1   ivabradine  (CORLANOR) 5 MG TABS tablet Take 1 tablet (5 mg total) by mouth 2 (two) times daily with a meal. 60 tablet 5   rosuvastatin  (CRESTOR ) 20 MG tablet Take 1 tablet (20 mg total) by mouth at bedtime. 30 tablet 5    sacubitril -valsartan  (ENTRESTO ) 97-103 MG Take 1 tablet by mouth 2 (two) times daily. 60 tablet 5   spironolactone  (ALDACTONE ) 25 MG tablet Take 1 tablet (25 mg total) by mouth daily. 30 tablet 5   TOUJEO  SOLOSTAR 300 UNIT/ML Solostar Pen Inject 25 Units into the skin at bedtime. 10 mL 2   VITAMIN D PO Take 1 tablet by mouth daily.     No current facility-administered medications for this encounter.   Blood pressure (!) 154/72, pulse 85, height 6' (1.829 m), weight 120.7 kg (266 lb), SpO2 98%.   Wt Readings from Last 3 Encounters:  07/26/24 120.7 kg (266 lb)  06/21/24 116 kg (255 lb 12.8 oz)  06/08/24 119.7 kg (264 lb)    PHYSICAL EXAM: General: Well appearing. No distress on RA Cardiac: JVP flat. S1 and S2 present. No murmurs or rub. Abdomen: Soft, non-tender, non-distended.  Extremities: Warm and dry.  No peripheral edema.  Neuro: Alert and oriented x3. Affect pleasant. Moves all extremities without difficulty.  ASSESSMENT & PLAN:   1. 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  - Severe LVH: ?HTN CM. Undergoing PET scan for sarcoidosis workup.  - R/LHC 5/25: with minimal CAD, elevated filling pressures, CI 2.6 T/3.1 F.     - CMRI 5/25: LVEF 27% RV 29%. Moderate circumferential pericardial effusion without tamponade. LGE suggestive of prior myocarditis or possible cardiac sarcoid.  - Echo today with improved EF 45-50%, severe LVH, G1DD, normal RV, no AS - NYHA I-II. Euvolemic. Continue Lasix  20 mg PRN.  - Continue Jardiance  10 daily - Continue Entresto  97-103 mg bid. BMET today - Continue Coreg  25 mg bid  - Continue Spironolactone  25 mg daily  - Continue Ivabridine 5 mg daily - Continue Digoxin  0.125. Check level  - Some medications without recent fill reports: coreg , ivabridine. Brief insurance coverage issue, hopefully compliance will improve once resolved. See below.  - PET scan to evalaute for sarcoidosis scheduled for 8/28 - With severe  LVH could consider check genetic testing once Medicaid reinstated to r/o familial HCM, does not report history of cardiac disease.   2. HTN:  - BP up, however did not take meds this morning, reports he will take when he gets back home - GDMT per above  3. OSA:  - moderate OSA on split night 7/25 - needs to start APAP therapy, number given for sleep medicine, have been trying to reach him.   4. Obesity: - Body mass index is 36.08 kg/m. - refer to PharmD Clinic for GLP1RA  5. Type 2 DM - recent Hgb A1c 7.6 - on SGLT2i + Trulicity  - would benefit from GLP1 given concomitant OSA, obesity, and HF   6. Pericardial Effusion - now small on echo today  Seen by Jenna, SW today. Previously had Medicaid, however needs to resubmit form to restart coverage. His is going to [facility] office today to fix. Has medication at home until this is resolved. Plans to call back office if unable to get coverage. Will plan to use HF fund, patient assistance, and/or samples if need be.   Follow up in 2 month with Dr. Cherrie  [place] Khaleb Broz, NP  11:07 AM

## 2024-07-26 ENCOUNTER — Ambulatory Visit (HOSPITAL_COMMUNITY)
Admission: RE | Admit: 2024-07-26 | Discharge: 2024-07-26 | Disposition: A | Discharge status: Home / Self Care | Payer: Self-pay | Source: Ambulatory Visit | Attending: Cardiology | Admitting: Cardiology

## 2024-07-26 ENCOUNTER — Ambulatory Visit (HOSPITAL_COMMUNITY): Payer: Self-pay | Admitting: Cardiology

## 2024-07-26 ENCOUNTER — Encounter (HOSPITAL_COMMUNITY): Payer: Self-pay

## 2024-07-26 ENCOUNTER — Ambulatory Visit (HOSPITAL_BASED_OUTPATIENT_CLINIC_OR_DEPARTMENT_OTHER)
Admission: RE | Admit: 2024-07-26 | Discharge: 2024-07-26 | Disposition: A | Discharge status: Home / Self Care | Payer: Self-pay | Source: Ambulatory Visit | Attending: Cardiology | Admitting: Cardiology

## 2024-07-26 VITALS — BP 154/72 | HR 85 | Ht 72.0 in | Wt 266.0 lb

## 2024-07-26 DIAGNOSIS — E119 Type 2 diabetes mellitus without complications: Secondary | ICD-10-CM

## 2024-07-26 DIAGNOSIS — G4733 Obstructive sleep apnea (adult) (pediatric): Secondary | ICD-10-CM

## 2024-07-26 DIAGNOSIS — I509 Heart failure, unspecified: Secondary | ICD-10-CM | POA: Insufficient documentation

## 2024-07-26 DIAGNOSIS — E66812 Obesity, class 2: Secondary | ICD-10-CM

## 2024-07-26 DIAGNOSIS — Z794 Long term (current) use of insulin: Secondary | ICD-10-CM | POA: Insufficient documentation

## 2024-07-26 DIAGNOSIS — I10 Essential (primary) hypertension: Secondary | ICD-10-CM

## 2024-07-26 DIAGNOSIS — I3139 Other pericardial effusion (noninflammatory): Secondary | ICD-10-CM | POA: Insufficient documentation

## 2024-07-26 DIAGNOSIS — E785 Hyperlipidemia, unspecified: Secondary | ICD-10-CM | POA: Insufficient documentation

## 2024-07-26 DIAGNOSIS — G4730 Sleep apnea, unspecified: Secondary | ICD-10-CM | POA: Insufficient documentation

## 2024-07-26 DIAGNOSIS — R000 Tachycardia, unspecified: Secondary | ICD-10-CM | POA: Insufficient documentation

## 2024-07-26 DIAGNOSIS — I428 Other cardiomyopathies: Secondary | ICD-10-CM

## 2024-07-26 DIAGNOSIS — I2510 Atherosclerotic heart disease of native coronary artery without angina pectoris: Secondary | ICD-10-CM | POA: Insufficient documentation

## 2024-07-26 DIAGNOSIS — I5022 Chronic systolic (congestive) heart failure: Secondary | ICD-10-CM

## 2024-07-26 DIAGNOSIS — I110 Hypertensive heart disease with heart failure: Secondary | ICD-10-CM | POA: Insufficient documentation

## 2024-07-26 DIAGNOSIS — Z7985 Long-term (current) use of injectable non-insulin antidiabetic drugs: Secondary | ICD-10-CM | POA: Insufficient documentation

## 2024-07-26 DIAGNOSIS — E669 Obesity, unspecified: Secondary | ICD-10-CM | POA: Insufficient documentation

## 2024-07-26 DIAGNOSIS — Z7984 Long term (current) use of oral hypoglycemic drugs: Secondary | ICD-10-CM | POA: Insufficient documentation

## 2024-07-26 DIAGNOSIS — Z79899 Other long term (current) drug therapy: Secondary | ICD-10-CM | POA: Insufficient documentation

## 2024-07-26 DIAGNOSIS — Z6836 Body mass index (BMI) 36.0-36.9, adult: Secondary | ICD-10-CM

## 2024-07-26 DIAGNOSIS — Z5986 Financial insecurity: Secondary | ICD-10-CM | POA: Insufficient documentation

## 2024-07-26 LAB — BASIC METABOLIC PANEL WITH GFR
Anion gap: 13 (ref 5–15)
BUN: 9 mg/dL (ref 6–20)
CO2: 25 mmol/L (ref 22–32)
Calcium: 9 mg/dL (ref 8.9–10.3)
Chloride: 102 mmol/L (ref 98–111)
Creatinine, Ser: 0.74 mg/dL (ref 0.61–1.24)
GFR, Estimated: >60 mL/min (ref >60)
Glucose, Bld: 119 mg/dL — ABNORMAL HIGH (ref 70–99)
Potassium: 3.6 mmol/L (ref 3.5–5.1)
Sodium: 140 mmol/L (ref 135–145)

## 2024-07-26 LAB — ECHOCARDIOGRAM COMPLETE
Area-P 1/2: 2.83 cm2
S' Lateral: 3.44 cm

## 2024-07-26 LAB — DIGOXIN LEVEL: Digoxin Level: <0.4 ng/mL — ABNORMAL LOW (ref 0.8–2.0)

## 2024-07-26 NOTE — Patient Instructions (Addendum)
 Good to see you today!  Labs done today, your results will be available in MyChart, we will contact you for abnormal readings.  Please call the sleep center [phone]  You have been referred to pharm D they will call to schedule an appointment  Your physician recommends that you schedule a follow-up appointment 2 months(October) Call office in September to schedule an appointment  If you have any questions or concerns before your next appointment please send us  a message through [place] [place] or call our office at [phone].    TO LEAVE A MESSAGE FOR THE NURSE SELECT OPTION 2, PLEASE LEAVE A MESSAGE INCLUDING: YOUR NAME DATE OF BIRTH CALL BACK NUMBER REASON FOR CALL**this is important as we prioritize the call backs  YOU WILL RECEIVE A CALL BACK THE SAME DAY AS LONG AS YOU CALL BEFORE 4:00 PM At the [facility], you and your health needs are our priority. As part of our continuing mission to provide you with exceptional heart care, we have created designated Provider Care Teams. These Care Teams include your primary Cardiologist (physician) and Advanced Practice Providers (APPs- Physician Assistants and Nurse Practitioners) who all work together to provide you with the care you need, when you need it.   You may see any of the following providers on your designated Care Team at your next follow up: Dr Toribio Fuel Dr Ezra Shuck Dr. Ria Commander Dr. Morene Brownie Amy Lenetta, NP Caffie Shed, [place] [facility] [place], [place] Beckey Coe, NP [place] Lee, NP Ellouise Class, NP Tinnie Redman, PharmD Jaun Bash, PharmD   Please be sure to bring in all your medications bottles to every appointment.    Thank you for choosing [facility]

## 2024-07-26 NOTE — Progress Notes (Signed)
  Echocardiogram 2D Echocardiogram has been performed.  Koleen KANDICE Popper, RDCS 07/26/2024, 10:31 AM

## 2024-07-26 NOTE — Progress Notes (Signed)
  Heart and Vascular Care Navigation  07/26/2024  Christopher Hays [birth year] [id number]  Reason for Referral: insurance concerns   Engaged with patient face to face for initial visit for Heart and Vascular Care Coordination.                                                                                                   Assessment:      Pt reports he had active Medicaid up until about month ago- reports no notifications from [facility] regarding benefits being stopped but that now when he looks at med cost it would be very expensive so appears as if benefits are not active.  Reports he actually has lower income than when he was previously active with Medicaid so no reason to believe he should not qualify at this time.  Provided pt with information regarding [employer] [facility] [employer] workers- he will go there after this appt to hopefully recertify- will call CSW to inform of results of this meeting.     Encouraged pt to reach out if he needs to refill meds before this is resolved and we would assist in obtaining everything we could.                             HRT/VAS Care Coordination     Living arrangements for the past 2 months Apartment   Lives with: Spouse       Social History:                                                                             SDOH Screenings   Food Insecurity: Low Risk  (06/14/2024)   Received from [facility]  Housing: Low Risk  (06/14/2024)   Received from [facility]  Transportation Needs: No Transportation Needs (06/14/2024)   Received from [facility]  Utilities: Low Risk  (06/14/2024)   Received from [facility]  Depression (PHQ2-9): Low Risk  (06/08/2024)  Financial Resource Strain: High Risk (09/03/2021)   Received from [facility]  Physical Activity: Insufficiently Active (09/03/2021)   Received from [facility]  Social Connections: Unknown (04/19/2022)   Received from [facility]  Stress: Stress Concern Present (09/03/2021)   Received from  [facility]  Tobacco Use: Low Risk  (07/26/2024)    Follow-up plan:    Pt to call CSW and inform of what is going on with his Medicaid.  Andriette HILARIO Leech, LCSW Clinical Social Worker [facility] Desk#: [phone] Cell#: [phone]

## 2024-08-07 ENCOUNTER — Telehealth (HOSPITAL_COMMUNITY): Payer: Self-pay | Admitting: *Deleted

## 2024-08-07 NOTE — Telephone Encounter (Signed)
 Reaching out to patient to offer assistance regarding upcoming cardiac imaging study; pt verbalizes understanding of appt date/time, parking situation and where to check in, pre-test NPO status, and verified current allergies; name and call back number provided for further questions should they arise  Chantal Requena RN Navigator Cardiac Imaging [facility] [phone] office [phone] cell  Patient verbalizes understanding of diet prep.

## 2024-08-09 ENCOUNTER — Encounter (HOSPITAL_COMMUNITY): Admission: RE | Admit: 2024-08-09 | Payer: Self-pay | Source: Ambulatory Visit

## 2024-08-10 ENCOUNTER — Encounter (HOSPITAL_COMMUNITY): Payer: Self-pay | Admitting: *Deleted

## 2024-08-10 NOTE — Progress Notes (Signed)
 Disability form completed, signed by Dr Cherrie, and faxed int [employer] at [phone]

## 2024-08-14 ENCOUNTER — Telehealth (HOSPITAL_COMMUNITY): Payer: Self-pay | Admitting: Emergency Medicine

## 2024-08-14 NOTE — Telephone Encounter (Signed)
Attempted to call patient regarding upcoming cardiac PET appointment. Left message on voicemail with name and callback number Aidan Moten RN Navigator Cardiac Imaging [facility] [phone] Office [phone] Cell  

## 2024-08-16 ENCOUNTER — Inpatient Hospital Stay (HOSPITAL_COMMUNITY): Admission: RE | Admit: 2024-08-16 | Payer: Self-pay | Source: Ambulatory Visit

## 2024-08-21 ENCOUNTER — Telehealth (HOSPITAL_COMMUNITY): Payer: Self-pay | Admitting: *Deleted

## 2024-08-21 NOTE — Telephone Encounter (Signed)
 Attempted to call patient regarding upcoming cardiac PET appointment. Left message on voicemail with name and callback number  Larey Brick RN Navigator Cardiac Imaging [facility] [phone] Office [phone] Cell

## 2024-08-23 ENCOUNTER — Ambulatory Visit (HOSPITAL_COMMUNITY): Admission: RE | Admit: 2024-08-23 | Payer: Self-pay | Source: Ambulatory Visit

## 2024-08-29 ENCOUNTER — Telehealth (HOSPITAL_COMMUNITY): Payer: Self-pay | Admitting: Internal Medicine

## 2024-08-29 NOTE — Telephone Encounter (Addendum)
 Per  secure chat 08/29/24 from Dr Bensimhon due to patient canelling multiple times he is cancelling this order.  Patient will need to follow up with Cardiology

## 2024-08-31 ENCOUNTER — Ambulatory Visit: Payer: Self-pay | Attending: Pharmacist | Admitting: Pharmacist

## 2024-08-31 NOTE — Progress Notes (Deleted)
 Patient ID: Christopher Hays                 DOB: 30-Jun-1986                    MRN: [id number]     HPI: Christopher Hays is a 38 y.o. male patient referred to pharmacy clinic by Christopher Lee, NP to initiate GLP1-RA therapy. PMH is significant for T2D, HTN, moderate OSA, HFrEF, and obesity. Most recent BMI 36.08 kg/m .  **Get weight  **Trulicity  on med list; last picked up 1.5 mg on 7/25 Tolerating well? Switch to mounjaro 2.5  Will likely start weaning off insulin , on 25 units Check sugars; f/u call in 3 weeks advance to 5 and then reduce insulin  at that time  Last A1c 7.6 in May  Baseline weight and BMI: 255 lbs and 34.69 kg/m  Current weight and BMI: *** kg/m  Current meds that affect weight: Toujeo    If diabetic and on insulin /sulfonylurea, can consider reducing dose to reduce risk of hypoglycemia; Pt on 25 units of Toujeo     Diet:   Exercise:   Family History:  Relation Problem Comments  Mother Metallurgist)   Father Metallurgist)   Sister (Alive)   Maternal Grandmother (Deceased)   Maternal Grandfather (Alive)   Paternal Grandmother Metallurgist) Gout     Paternal Grandfather Metallurgist) Gout     Neg Hx Diabetes       Social History:  Alcohol: Smoking:  Labs: Lab Results  Component Value Date   HGBA1C 7.6 (H) 04/19/2024    Wt Readings from Last 1 Encounters:  07/26/24 266 lb (120.7 kg)    BP Readings from Last 1 Encounters:  07/26/24 (!) 154/72   Pulse Readings from Last 1 Encounters:  07/26/24 85    No results found for: CHOL, TRIG, HDL, CHOLHDL, VLDL, LDLCALC, LDLDIRECT  Past Medical History:  Diagnosis Date   Diabetes mellitus without complication (HCC)    Gout    Hypertension    Sleep apnea     Current Outpatient Medications on File Prior to Visit  Medication Sig Dispense Refill   albuterol (VENTOLIN HFA) 108 (90 Base) MCG/ACT inhaler Inhale 2 puffs into the lungs every 6 (six) hours as needed for shortness of breath.     allopurinol  (ZYLOPRIM ) 100 MG  tablet Take 100 mg by mouth daily.  0   blood glucose meter kit and supplies Dispense based on patient and insurance preference. Use up to four times daily as directed. (FOR ICD-10 E10.9, E11.9). 1 each 0   Blood Glucose Monitoring Suppl DEVI 1 each by Does not apply route in the morning, at noon, and at bedtime. May substitute to any manufacturer covered by patient's insurance. 1 each 0   carvedilol  (COREG ) 6.25 MG tablet Take 1 tablet (6.25 mg total) by mouth 2 (two) times daily with a meal. 60 tablet 5   digoxin  (LANOXIN ) 0.125 MG tablet Take 1 tablet (0.125 mg total) by mouth daily. 30 tablet 5   Dulaglutide  (TRULICITY ) 1.5 MG/0.5ML SOAJ Inject 1.5 mg into the skin once a week. 2 mL 2   empagliflozin  (JARDIANCE ) 10 MG TABS tablet Take 1 tablet (10 mg total) by mouth daily. 30 tablet 5   Ferrous Sulfate  (IRON ) 90 (18 Fe) MG TABS Take 1 tablet by mouth daily after breakfast. 30 tablet 1   furosemide  (LASIX ) 20 MG tablet Take 1 tablet (20 mg total) by mouth daily as needed. 30 tablet 5   Insulin   Pen Needle 31G X 5 MM MISC Use with insulin  pen 100 each 1   ivabradine  (CORLANOR) 5 MG TABS tablet Take 1 tablet (5 mg total) by mouth 2 (two) times daily with a meal. 60 tablet 5   rosuvastatin  (CRESTOR ) 20 MG tablet Take 1 tablet (20 mg total) by mouth at bedtime. 30 tablet 5   sacubitril -valsartan  (ENTRESTO ) 97-103 MG Take 1 tablet by mouth 2 (two) times daily. 60 tablet 5   spironolactone  (ALDACTONE ) 25 MG tablet Take 1 tablet (25 mg total) by mouth daily. 30 tablet 5   TOUJEO  SOLOSTAR 300 UNIT/ML Solostar Pen Inject 25 Units into the skin at bedtime. 10 mL 2   VITAMIN D PO Take 1 tablet by mouth daily.     No current facility-administered medications on file prior to visit.    No Known Allergies   Assessment/Plan:  1. Weight loss - Patient has not met goal of at least 5% of body weight loss with comprehensive lifestyle modifications alone in the past 3-6 months. Pharmacotherapy is appropriate  to pursue as augmentation. Will start*** . Confirmed patient not ***pregnant and no personal or family history of medullary thyroid carcinoma (MTC) or Multiple Endocrine Neoplasia syndrome type 2 (MEN 2). Injection technique reviewed at today's visit.  Advised patient on common side effects including nausea, diarrhea, dyspepsia, decreased appetite, and fatigue. Counseled patient on reducing meal size and how to titrate medication to minimize side effects. Counseled patient to call if intolerable side effects or if experiencing dehydration, abdominal pain, or dizziness. Along with pharmacotherapy, the patient will follow dietary modifications and aim for at least 150 minutes of moderate-intensity exercise per week, plus resistance training twice a week (as recommended by the American Heart Association). This resistance training--such as weightlifting, bodyweight exercises, or using resistance bands, adapted to the patient's ability--will help prevent muscle loss.  Follow up in 1-2 days regarding coverage of *** . If therapy is initiated, phone follow-ups will be conducted every 4 weeks for dose titration until the patient reaches the effective therapeutic dose and target weight.  Matthew Pais E. Merland Holness, Pharm.D [facility] [address], [place], [place] [address] Phone: [phone]; Fax: [phone]
